# Patient Record
Sex: Male | Born: 1950 | Race: White | Hispanic: No | Marital: Married | Smoking: Former smoker
Health system: Southern US, Community
[De-identification: ages and names within clinical notes are randomized; demographics above are authoritative.]

## PROBLEM LIST (undated history)

## (undated) DIAGNOSIS — M1990 Unspecified osteoarthritis, unspecified site: Secondary | ICD-10-CM

## (undated) DIAGNOSIS — N189 Chronic kidney disease, unspecified: Secondary | ICD-10-CM

## (undated) HISTORY — PX: TRIGGER FINGER RELEASE: SHX641

---

## 2001-02-08 ENCOUNTER — Other Ambulatory Visit: Admission: RE | Admit: 2001-02-08 | Discharge: 2001-02-08 | Payer: Self-pay | Admitting: Family Medicine

## 2015-01-03 ENCOUNTER — Other Ambulatory Visit (HOSPITAL_COMMUNITY): Payer: Self-pay | Admitting: Orthopaedic Surgery

## 2015-01-03 DIAGNOSIS — M5442 Lumbago with sciatica, left side: Principal | ICD-10-CM

## 2015-01-03 DIAGNOSIS — M5441 Lumbago with sciatica, right side: Secondary | ICD-10-CM

## 2015-01-16 ENCOUNTER — Other Ambulatory Visit (HOSPITAL_COMMUNITY): Payer: Self-pay | Admitting: Orthopaedic Surgery

## 2015-01-16 ENCOUNTER — Ambulatory Visit (HOSPITAL_COMMUNITY)
Admission: RE | Admit: 2015-01-16 | Discharge: 2015-01-16 | Disposition: A | Discharge status: Home / Self Care | Payer: BLUE CROSS/BLUE SHIELD | Source: Ambulatory Visit | Attending: Orthopaedic Surgery | Admitting: Orthopaedic Surgery

## 2015-01-16 DIAGNOSIS — M47816 Spondylosis without myelopathy or radiculopathy, lumbar region: Secondary | ICD-10-CM | POA: Insufficient documentation

## 2015-01-16 DIAGNOSIS — M2580 Other specified joint disorders, unspecified joint: Secondary | ICD-10-CM | POA: Diagnosis not present

## 2015-01-16 DIAGNOSIS — M5136 Other intervertebral disc degeneration, lumbar region: Secondary | ICD-10-CM | POA: Diagnosis not present

## 2015-01-16 DIAGNOSIS — R52 Pain, unspecified: Secondary | ICD-10-CM | POA: Insufficient documentation

## 2015-01-16 DIAGNOSIS — I700 Atherosclerosis of aorta: Secondary | ICD-10-CM | POA: Insufficient documentation

## 2015-01-16 DIAGNOSIS — M5441 Lumbago with sciatica, right side: Secondary | ICD-10-CM

## 2015-01-16 DIAGNOSIS — M545 Low back pain, unspecified: Secondary | ICD-10-CM

## 2015-01-16 DIAGNOSIS — Z139 Encounter for screening, unspecified: Secondary | ICD-10-CM | POA: Insufficient documentation

## 2015-01-16 DIAGNOSIS — M5442 Lumbago with sciatica, left side: Secondary | ICD-10-CM

## 2015-01-16 DIAGNOSIS — M795 Residual foreign body in soft tissue: Secondary | ICD-10-CM | POA: Insufficient documentation

## 2015-09-12 ENCOUNTER — Other Ambulatory Visit (HOSPITAL_COMMUNITY): Payer: Self-pay | Admitting: Orthopaedic Surgery

## 2015-09-12 DIAGNOSIS — M5441 Lumbago with sciatica, right side: Secondary | ICD-10-CM

## 2015-09-12 DIAGNOSIS — M5442 Lumbago with sciatica, left side: Principal | ICD-10-CM

## 2015-09-26 ENCOUNTER — Ambulatory Visit (HOSPITAL_COMMUNITY)
Admission: RE | Admit: 2015-09-26 | Discharge: 2015-09-26 | Disposition: A | Discharge status: Home / Self Care | Payer: BLUE CROSS/BLUE SHIELD | Source: Ambulatory Visit | Attending: Orthopaedic Surgery | Admitting: Orthopaedic Surgery

## 2015-09-26 DIAGNOSIS — M79604 Pain in right leg: Secondary | ICD-10-CM | POA: Diagnosis present

## 2015-09-26 DIAGNOSIS — M545 Low back pain: Secondary | ICD-10-CM | POA: Insufficient documentation

## 2015-09-26 DIAGNOSIS — M4806 Spinal stenosis, lumbar region: Secondary | ICD-10-CM | POA: Insufficient documentation

## 2015-09-26 DIAGNOSIS — M5442 Lumbago with sciatica, left side: Secondary | ICD-10-CM

## 2015-09-26 DIAGNOSIS — M5126 Other intervertebral disc displacement, lumbar region: Secondary | ICD-10-CM | POA: Insufficient documentation

## 2015-09-26 DIAGNOSIS — M5136 Other intervertebral disc degeneration, lumbar region: Secondary | ICD-10-CM | POA: Insufficient documentation

## 2015-09-26 DIAGNOSIS — M5441 Lumbago with sciatica, right side: Secondary | ICD-10-CM

## 2015-10-24 ENCOUNTER — Ambulatory Visit (HOSPITAL_COMMUNITY)
Admission: RE | Admit: 2015-10-24 | Discharge: 2015-10-24 | Disposition: A | Discharge status: Home / Self Care | Payer: BLUE CROSS/BLUE SHIELD | Source: Ambulatory Visit | Attending: Surgery | Admitting: Surgery

## 2015-10-24 ENCOUNTER — Encounter (HOSPITAL_COMMUNITY)
Admission: RE | Admit: 2015-10-24 | Discharge: 2015-10-24 | Disposition: A | Discharge status: Home / Self Care | Payer: BLUE CROSS/BLUE SHIELD | Source: Ambulatory Visit | Attending: Specialist | Admitting: Specialist

## 2015-10-24 ENCOUNTER — Encounter (HOSPITAL_COMMUNITY): Payer: Self-pay

## 2015-10-24 DIAGNOSIS — Z01812 Encounter for preprocedural laboratory examination: Secondary | ICD-10-CM | POA: Diagnosis not present

## 2015-10-24 DIAGNOSIS — Z01818 Encounter for other preprocedural examination: Secondary | ICD-10-CM | POA: Diagnosis not present

## 2015-10-24 DIAGNOSIS — Z01810 Encounter for preprocedural cardiovascular examination: Secondary | ICD-10-CM | POA: Insufficient documentation

## 2015-10-24 HISTORY — DX: Chronic kidney disease, unspecified: N18.9

## 2015-10-24 HISTORY — DX: Unspecified osteoarthritis, unspecified site: M19.90

## 2015-10-24 LAB — URINALYSIS, ROUTINE W REFLEX MICROSCOPIC
Glucose, UA: NEGATIVE mg/dL
HGB URINE DIPSTICK: NEGATIVE
KETONES UR: NEGATIVE mg/dL
Leukocytes, UA: NEGATIVE
NITRITE: NEGATIVE
Protein, ur: NEGATIVE mg/dL
SPECIFIC GRAVITY, URINE: 1.043 — AB (ref 1.005–1.030)
pH: 5 (ref 5.0–8.0)

## 2015-10-24 LAB — COMPREHENSIVE METABOLIC PANEL
ALK PHOS: 75 U/L (ref 38–126)
ALT: 30 U/L (ref 17–63)
ANION GAP: 8 (ref 5–15)
AST: 35 U/L (ref 15–41)
Albumin: 4.1 g/dL (ref 3.5–5.0)
BILIRUBIN TOTAL: 0.5 mg/dL (ref 0.3–1.2)
BUN: 19 mg/dL (ref 6–20)
CALCIUM: 10.2 mg/dL (ref 8.9–10.3)
CO2: 26 mmol/L (ref 22–32)
Chloride: 108 mmol/L (ref 101–111)
Creatinine, Ser: 1.16 mg/dL (ref 0.61–1.24)
GFR calc Af Amer: >60 mL/min (ref >60)
GFR calc non Af Amer: >60 mL/min (ref >60)
Glucose, Bld: 151 mg/dL — ABNORMAL HIGH (ref 65–99)
POTASSIUM: 4.1 mmol/L (ref 3.5–5.1)
Sodium: 142 mmol/L (ref 135–145)
TOTAL PROTEIN: 7.1 g/dL (ref 6.5–8.1)

## 2015-10-24 LAB — CBC
HEMATOCRIT: 41.4 % (ref 39.0–52.0)
HEMOGLOBIN: 14 g/dL (ref 13.0–17.0)
MCH: 30.4 pg (ref 26.0–34.0)
MCHC: 33.8 g/dL (ref 30.0–36.0)
MCV: 90 fL (ref 78.0–100.0)
Platelets: 242 10*3/uL (ref 150–400)
RBC: 4.6 MIL/uL (ref 4.22–5.81)
RDW: 12.1 % (ref 11.5–15.5)
WBC: 7.5 10*3/uL (ref 4.0–10.5)

## 2015-10-24 LAB — SURGICAL PCR SCREEN
MRSA, PCR: NEGATIVE
STAPHYLOCOCCUS AUREUS: NEGATIVE

## 2015-10-24 LAB — PROTIME-INR
INR: 1.01 (ref 0.00–1.49)
PROTHROMBIN TIME: 13.5 s (ref 11.6–15.2)

## 2015-10-24 LAB — APTT: APTT: 29 s (ref 24–37)

## 2015-10-24 NOTE — Progress Notes (Signed)
Denies any cardiac issues.  No heart tests done. Only had 1 surgery which was trigger finger release PCP [place] Dr. Sudie BaileyKnowlton in [place], [place]   LOV  05/2015

## 2015-10-24 NOTE — Pre-Procedure Instructions (Signed)
Micheal QuarryJohn Mosley  10/24/2015      [name] - [place], [place] - [name] [place] [address] Phone: [phone] Fax: [phone]    Your procedure is scheduled on Monday, March 20th   [facility] Admitting at 5:30 AM             (Posted surgery time 7:30 am - 10:12 am)   Call this number if you have problems the morning of surgery:  [phone]   Remember:  Do not eat food or drink liquids after midnight Sunday.   Take these medicines the morning of surgery with A SIP OF WATER : NONE             4-5 days prior to surgery, STOP taking any vitamins, herbal supplements or medications, anti-inflammatories.   Do not wear jewelry, make-up or nail polish.  Do not wear lotions, powders, or perfumes.  You may NOT wear deodorant the day of surgery.   Do not shave underarms & legs 48 hours prior to surgery.    Do not bring valuables to the hospital.  [facility] is not responsible for any belongings or valuables.  Contacts, dentures or bridgework may not be worn into surgery.  Leave your suitcase in the car.  After surgery it may be brought to your room. For patients admitted to the hospital, discharge time will be determined by your treatment team.  Name and phone number of your driver:     Please read over the following fact sheets that you were given. Pain Booklet, MRSA Information and Surgical Site Infection Prevention

## 2015-10-26 MED ORDER — CEFAZOLIN SODIUM-DEXTROSE 2-3 GM-% IV SOLR
2.0000 g | INTRAVENOUS | Status: AC
  Administered 2015-10-29: 2 g via INTRAVENOUS
  Filled 2015-10-26: qty 50

## 2015-10-28 NOTE — Anesthesia Preprocedure Evaluation (Addendum)
Anesthesia Evaluation  Patient identified by MRN, date of birth, ID band Patient awake    Reviewed: Allergy & Precautions, NPO status , Patient's Chart, lab work & pertinent test results  History of Anesthesia Complications Negative for: history of anesthetic complications  Airway Mallampati: I  TM Distance: >3 FB Neck ROM: Full    Dental  (+) Edentulous Lower, Edentulous Upper, Missing   Pulmonary COPD, former smoker,    breath sounds clear to auscultation       Cardiovascular (-) anginanegative cardio ROS   Rhythm:Regular Rate:Normal     Neuro/Psych Spinal stenosis    GI/Hepatic negative GI ROS, Neg liver ROS,   Endo/Other  negative endocrine ROS  Renal/GU H/o stone     Musculoskeletal  (+) Arthritis , Osteoarthritis,    Abdominal (+)  Abdomen: soft. Bowel sounds: normal.  Peds  Hematology negative hematology ROS (+)   Anesthesia Other Findings   Reproductive/Obstetrics                         10/24/15 EKG: Normal sinus rhythm Normal ECG Conf[facility] by ARIDA [facility]  CBC    Component Value Date/Time   WBC 7.5 10/24/2015 1545   RBC 4.60 10/24/2015 1545   HGB 14.0 10/24/2015 1545   HCT 41.4 10/24/2015 1545   PLT 242 10/24/2015 1545   MCV 90.0 10/24/2015 1545   MCH 30.4 10/24/2015 1545   MCHC 33.8 10/24/2015 1545   RDW 12.1 10/24/2015 1545     Chemistry      Component Value Date/Time   NA 142 10/24/2015 1545   K 4.1 10/24/2015 1545   CL 108 10/24/2015 1545   CO2 26 10/24/2015 1545   BUN 19 10/24/2015 1545   CREATININE 1.16 10/24/2015 1545      Component Value Date/Time   CALCIUM 10.2 10/24/2015 1545   ALKPHOS 75 10/24/2015 1545   AST 35 10/24/2015 1545   ALT 30 10/24/2015 1545   BILITOT 0.5 10/24/2015 1545     BP Readings from Last 3 Encounters:  10/29/15 157/79      Anesthesia Physical Anesthesia Plan  ASA: II  Anesthesia Plan: General   Post-op  Pain Management:    Induction: Intravenous  Airway Management Planned: Oral ETT  Additional Equipment:   Intra-op Plan:   Post-operative Plan: Extubation in OR  Informed Consent: I have reviewed the patients History and Physical, chart, labs and discussed the procedure including the risks, benefits and alternatives for the proposed anesthesia with the patient or authorized representative who has indicated his/her understanding and acceptance.     Plan Discussed with: CRNA and Surgeon  Anesthesia Plan Comments: (Plan routine monitors, GETA)        Anesthesia Quick Evaluation

## 2015-10-29 ENCOUNTER — Observation Stay (HOSPITAL_COMMUNITY)
Admission: RE | Admit: 2015-10-29 | Discharge: 2015-10-29 | Disposition: A | Discharge status: Home / Self Care | Payer: BLUE CROSS/BLUE SHIELD | Source: Ambulatory Visit | Attending: Specialist | Admitting: Specialist

## 2015-10-29 ENCOUNTER — Ambulatory Visit (HOSPITAL_COMMUNITY): Payer: BLUE CROSS/BLUE SHIELD | Admitting: Anesthesiology

## 2015-10-29 ENCOUNTER — Encounter (HOSPITAL_COMMUNITY)
Admission: RE | Disposition: A | Discharge status: Home / Self Care | Payer: Self-pay | Source: Ambulatory Visit | Attending: Specialist

## 2015-10-29 ENCOUNTER — Encounter (HOSPITAL_COMMUNITY): Payer: Self-pay | Admitting: Surgery

## 2015-10-29 ENCOUNTER — Ambulatory Visit (HOSPITAL_COMMUNITY): Payer: BLUE CROSS/BLUE SHIELD

## 2015-10-29 DIAGNOSIS — Z87891 Personal history of nicotine dependence: Secondary | ICD-10-CM | POA: Diagnosis not present

## 2015-10-29 DIAGNOSIS — M1990 Unspecified osteoarthritis, unspecified site: Secondary | ICD-10-CM | POA: Diagnosis not present

## 2015-10-29 DIAGNOSIS — Z419 Encounter for procedure for purposes other than remedying health state, unspecified: Secondary | ICD-10-CM

## 2015-10-29 DIAGNOSIS — M5116 Intervertebral disc disorders with radiculopathy, lumbar region: Secondary | ICD-10-CM | POA: Insufficient documentation

## 2015-10-29 DIAGNOSIS — M5136 Other intervertebral disc degeneration, lumbar region: Secondary | ICD-10-CM | POA: Diagnosis not present

## 2015-10-29 DIAGNOSIS — M48062 Spinal stenosis, lumbar region with neurogenic claudication: Secondary | ICD-10-CM | POA: Diagnosis present

## 2015-10-29 DIAGNOSIS — J449 Chronic obstructive pulmonary disease, unspecified: Secondary | ICD-10-CM | POA: Insufficient documentation

## 2015-10-29 DIAGNOSIS — M4806 Spinal stenosis, lumbar region: Secondary | ICD-10-CM | POA: Diagnosis present

## 2015-10-29 DIAGNOSIS — Z87442 Personal history of urinary calculi: Secondary | ICD-10-CM | POA: Diagnosis not present

## 2015-10-29 HISTORY — PX: LUMBAR LAMINECTOMY/DECOMPRESSION MICRODISCECTOMY: SHX5026

## 2015-10-29 SURGERY — LUMBAR LAMINECTOMY/DECOMPRESSION MICRODISCECTOMY
Anesthesia: General

## 2015-10-29 MED ORDER — ALUM & MAG HYDROXIDE-SIMETH 200-200-20 MG/5ML PO SUSP
30.0000 mL | Freq: Four times a day (QID) | ORAL | Status: DC | PRN
Start: 2015-10-29 — End: 2015-10-29

## 2015-10-29 MED ORDER — LIDOCAINE HCL (CARDIAC) 20 MG/ML IV SOLN
INTRAVENOUS | Status: AC
  Filled 2015-10-29: qty 5

## 2015-10-29 MED ORDER — ACETAMINOPHEN 325 MG PO TABS
650.0000 mg | ORAL_TABLET | ORAL | Status: DC | PRN
  Filled 2015-10-29 (×2): qty 2

## 2015-10-29 MED ORDER — CEFAZOLIN SODIUM 1-5 GM-% IV SOLN
1.0000 g | Freq: Three times a day (TID) | INTRAVENOUS | Status: DC
  Administered 2015-10-29: 1 g via INTRAVENOUS
  Filled 2015-10-29 (×2): qty 50

## 2015-10-29 MED ORDER — THROMBIN 20000 UNITS EX SOLR
CUTANEOUS | Status: AC
Start: 2015-10-29 — End: 2015-10-29
  Filled 2015-10-29: qty 20000

## 2015-10-29 MED ORDER — PROPOFOL 10 MG/ML IV BOLUS
INTRAVENOUS | Status: AC
  Filled 2015-10-29: qty 40

## 2015-10-29 MED ORDER — SODIUM CHLORIDE 0.9 % IJ SOLN
INTRAMUSCULAR | Status: AC
  Filled 2015-10-29: qty 10

## 2015-10-29 MED ORDER — MIDAZOLAM HCL 2 MG/2ML IJ SOLN: 0.5000 mg | Freq: Once | INTRAMUSCULAR | Status: DC | PRN

## 2015-10-29 MED ORDER — PHENYLEPHRINE 40 MCG/ML (10ML) SYRINGE FOR IV PUSH (FOR BLOOD PRESSURE SUPPORT)
PREFILLED_SYRINGE | INTRAVENOUS | Status: AC
  Filled 2015-10-29: qty 10

## 2015-10-29 MED ORDER — GLYCOPYRROLATE 0.2 MG/ML IJ SOLN
INTRAMUSCULAR | Status: AC
  Filled 2015-10-29: qty 1

## 2015-10-29 MED ORDER — FENTANYL CITRATE (PF) 250 MCG/5ML IJ SOLN
INTRAMUSCULAR | Status: AC
  Filled 2015-10-29: qty 5

## 2015-10-29 MED ORDER — METHOCARBAMOL 1000 MG/10ML IJ SOLN
500.0000 mg | Freq: Four times a day (QID) | INTRAVENOUS | Status: DC | PRN
  Filled 2015-10-29: qty 5

## 2015-10-29 MED ORDER — PROPOFOL 10 MG/ML IV BOLUS
INTRAVENOUS | Status: AC
  Filled 2015-10-29: qty 20

## 2015-10-29 MED ORDER — PHENYLEPHRINE 40 MCG/ML (10ML) SYRINGE FOR IV PUSH (FOR BLOOD PRESSURE SUPPORT)
PREFILLED_SYRINGE | INTRAVENOUS | Status: AC
Start: 2015-10-29 — End: 2015-10-29
  Filled 2015-10-29: qty 10

## 2015-10-29 MED ORDER — SODIUM CHLORIDE 0.9 % IV SOLN: 250.0000 mL | INTRAVENOUS | Status: DC

## 2015-10-29 MED ORDER — BUPIVACAINE LIPOSOME 1.3 % IJ SUSP
INTRAMUSCULAR | Status: DC | PRN
  Administered 2015-10-29: 20 mL

## 2015-10-29 MED ORDER — LACTATED RINGERS IV SOLN
INTRAVENOUS | Status: DC | PRN
  Administered 2015-10-29 (×2): via INTRAVENOUS

## 2015-10-29 MED ORDER — CELECOXIB 200 MG PO CAPS: 200.0000 mg | ORAL_CAPSULE | Freq: Every day | ORAL | Status: DC

## 2015-10-29 MED ORDER — ROCURONIUM BROMIDE 50 MG/5ML IV SOLN
INTRAVENOUS | Status: AC
  Filled 2015-10-29: qty 1

## 2015-10-29 MED ORDER — DEXAMETHASONE SODIUM PHOSPHATE 10 MG/ML IJ SOLN
INTRAMUSCULAR | Status: AC
  Filled 2015-10-29: qty 2

## 2015-10-29 MED ORDER — 0.9 % SODIUM CHLORIDE (POUR BTL) OPTIME
TOPICAL | Status: DC | PRN
  Administered 2015-10-29: 1000 mL

## 2015-10-29 MED ORDER — EPHEDRINE SULFATE 50 MG/ML IJ SOLN
INTRAMUSCULAR | Status: AC
  Filled 2015-10-29: qty 1

## 2015-10-29 MED ORDER — MORPHINE SULFATE (PF) 2 MG/ML IV SOLN: 1.0000 mg | INTRAVENOUS | Status: DC | PRN

## 2015-10-29 MED ORDER — POLYETHYLENE GLYCOL 3350 17 G PO PACK: 17.0000 g | PACK | Freq: Every day | ORAL | Status: DC | PRN

## 2015-10-29 MED ORDER — HYDROCODONE-ACETAMINOPHEN 5-325 MG PO TABS: 1.0000 | ORAL_TABLET | Freq: Four times a day (QID) | ORAL | Status: DC | PRN

## 2015-10-29 MED ORDER — FENTANYL CITRATE (PF) 250 MCG/5ML IJ SOLN
INTRAMUSCULAR | Status: AC
Start: 2015-10-29 — End: 2015-10-29
  Filled 2015-10-29: qty 5

## 2015-10-29 MED ORDER — FENTANYL CITRATE (PF) 100 MCG/2ML IJ SOLN
INTRAMUSCULAR | Status: DC | PRN
  Administered 2015-10-29 (×2): 50 ug via INTRAVENOUS
  Administered 2015-10-29: 250 ug via INTRAVENOUS

## 2015-10-29 MED ORDER — ONDANSETRON HCL 4 MG/2ML IJ SOLN
INTRAMUSCULAR | Status: AC
  Filled 2015-10-29: qty 2

## 2015-10-29 MED ORDER — PHENOL 1.4 % MT LIQD: 1.0000 | OROMUCOSAL | Status: DC | PRN

## 2015-10-29 MED ORDER — SODIUM CHLORIDE 0.9% FLUSH
3.0000 mL | Freq: Two times a day (BID) | INTRAVENOUS | Status: DC
  Administered 2015-10-29: 3 mL via INTRAVENOUS

## 2015-10-29 MED ORDER — ROCURONIUM BROMIDE 100 MG/10ML IV SOLN
INTRAVENOUS | Status: DC | PRN
  Administered 2015-10-29: 10 mg via INTRAVENOUS
  Administered 2015-10-29: 50 mg via INTRAVENOUS
  Administered 2015-10-29: 20 mg via INTRAVENOUS

## 2015-10-29 MED ORDER — HYDROCODONE-ACETAMINOPHEN 5-325 MG PO TABS: 1.0000 | ORAL_TABLET | ORAL | Status: DC | PRN

## 2015-10-29 MED ORDER — ACETAMINOPHEN 650 MG RE SUPP: 650.0000 mg | RECTAL | Status: DC | PRN

## 2015-10-29 MED ORDER — DEXAMETHASONE SODIUM PHOSPHATE 10 MG/ML IJ SOLN
INTRAMUSCULAR | Status: DC | PRN
  Administered 2015-10-29: 10 mg via INTRAVENOUS

## 2015-10-29 MED ORDER — LIDOCAINE HCL (CARDIAC) 20 MG/ML IV SOLN
INTRAVENOUS | Status: DC | PRN
  Administered 2015-10-29: 20 mg via INTRAVENOUS

## 2015-10-29 MED ORDER — KETOROLAC TROMETHAMINE 30 MG/ML IJ SOLN
30.0000 mg | Freq: Four times a day (QID) | INTRAMUSCULAR | Status: DC
  Administered 2015-10-29: 30 mg via INTRAVENOUS
  Filled 2015-10-29: qty 1

## 2015-10-29 MED ORDER — OXYCODONE-ACETAMINOPHEN 5-325 MG PO TABS: 1.0000 | ORAL_TABLET | ORAL | Status: DC | PRN

## 2015-10-29 MED ORDER — PHENYLEPHRINE HCL 10 MG/ML IJ SOLN
10.0000 mg | INTRAVENOUS | Status: DC | PRN
  Administered 2015-10-29: 15 ug/min via INTRAVENOUS

## 2015-10-29 MED ORDER — MEPERIDINE HCL 25 MG/ML IJ SOLN: 6.2500 mg | INTRAMUSCULAR | Status: DC | PRN

## 2015-10-29 MED ORDER — METHOCARBAMOL 500 MG PO TABS: 500.0000 mg | ORAL_TABLET | Freq: Three times a day (TID) | ORAL | Status: DC | PRN

## 2015-10-29 MED ORDER — PROPOFOL 10 MG/ML IV BOLUS
INTRAVENOUS | Status: DC | PRN
  Administered 2015-10-29: 120 mg via INTRAVENOUS

## 2015-10-29 MED ORDER — BUPIVACAINE HCL 0.5 % IJ SOLN
INTRAMUSCULAR | Status: DC | PRN
  Administered 2015-10-29: 30 mL

## 2015-10-29 MED ORDER — PHENYLEPHRINE HCL 10 MG/ML IJ SOLN
INTRAMUSCULAR | Status: DC | PRN
  Administered 2015-10-29: 80 ug via INTRAVENOUS
  Administered 2015-10-29: 120 ug via INTRAVENOUS
  Administered 2015-10-29: 80 ug via INTRAVENOUS
  Administered 2015-10-29: 120 ug via INTRAVENOUS
  Administered 2015-10-29 (×3): 80 ug via INTRAVENOUS

## 2015-10-29 MED ORDER — METHOCARBAMOL 500 MG PO TABS: 500.0000 mg | ORAL_TABLET | Freq: Four times a day (QID) | ORAL | Status: DC | PRN

## 2015-10-29 MED ORDER — MIDAZOLAM HCL 5 MG/5ML IJ SOLN
INTRAMUSCULAR | Status: DC | PRN
  Administered 2015-10-29 (×2): 1 mg via INTRAVENOUS

## 2015-10-29 MED ORDER — DOCUSATE SODIUM 100 MG PO CAPS: 100.0000 mg | ORAL_CAPSULE | Freq: Two times a day (BID) | ORAL | Status: DC

## 2015-10-29 MED ORDER — CHLORHEXIDINE GLUCONATE 4 % EX LIQD: 60.0000 mL | Freq: Once | CUTANEOUS | Status: DC

## 2015-10-29 MED ORDER — SUCCINYLCHOLINE CHLORIDE 20 MG/ML IJ SOLN
INTRAMUSCULAR | Status: AC
  Filled 2015-10-29: qty 1

## 2015-10-29 MED ORDER — SODIUM CHLORIDE 0.9% FLUSH: 3.0000 mL | INTRAVENOUS | Status: DC | PRN

## 2015-10-29 MED ORDER — PROMETHAZINE HCL 25 MG/ML IJ SOLN: 6.2500 mg | INTRAMUSCULAR | Status: DC | PRN

## 2015-10-29 MED ORDER — FLEET ENEMA 7-19 GM/118ML RE ENEM: 1.0000 | ENEMA | Freq: Once | RECTAL | Status: DC | PRN

## 2015-10-29 MED ORDER — HYDROMORPHONE HCL 1 MG/ML IJ SOLN: 0.2500 mg | INTRAMUSCULAR | Status: DC | PRN

## 2015-10-29 MED ORDER — PRAVASTATIN SODIUM 40 MG PO TABS
80.0000 mg | ORAL_TABLET | Freq: Every evening | ORAL | Status: DC
  Administered 2015-10-29: 80 mg via ORAL
  Filled 2015-10-29: qty 2

## 2015-10-29 MED ORDER — MENTHOL 3 MG MT LOZG
1.0000 | LOZENGE | OROMUCOSAL | Status: DC | PRN
Start: 2015-10-29 — End: 2015-10-29

## 2015-10-29 MED ORDER — PANTOPRAZOLE SODIUM 40 MG IV SOLR: 40.0000 mg | Freq: Every day | INTRAVENOUS | Status: DC

## 2015-10-29 MED ORDER — THROMBIN 20000 UNITS EX KIT
PACK | CUTANEOUS | Status: DC | PRN
  Administered 2015-10-29: 20000 [IU] via TOPICAL

## 2015-10-29 MED ORDER — KCL IN DEXTROSE-NACL 10-5-0.45 MEQ/L-%-% IV SOLN
INTRAVENOUS | Status: DC
  Administered 2015-10-29: 12:00:00 via INTRAVENOUS
  Filled 2015-10-29 (×2): qty 1000

## 2015-10-29 MED ORDER — SUGAMMADEX SODIUM 200 MG/2ML IV SOLN
INTRAVENOUS | Status: AC
  Filled 2015-10-29: qty 2

## 2015-10-29 MED ORDER — DEXAMETHASONE SODIUM PHOSPHATE 10 MG/ML IJ SOLN
INTRAMUSCULAR | Status: AC
  Filled 2015-10-29: qty 1

## 2015-10-29 MED ORDER — ONDANSETRON HCL 4 MG/2ML IJ SOLN: 4.0000 mg | INTRAMUSCULAR | Status: DC | PRN

## 2015-10-29 MED ORDER — ZOLPIDEM TARTRATE 5 MG PO TABS: 5.0000 mg | ORAL_TABLET | Freq: Every evening | ORAL | Status: DC | PRN

## 2015-10-29 MED ORDER — MIDAZOLAM HCL 2 MG/2ML IJ SOLN
INTRAMUSCULAR | Status: AC
  Filled 2015-10-29: qty 2

## 2015-10-29 MED ORDER — BISACODYL 5 MG PO TBEC: 5.0000 mg | DELAYED_RELEASE_TABLET | Freq: Every day | ORAL | Status: DC | PRN

## 2015-10-29 MED ORDER — SUGAMMADEX SODIUM 200 MG/2ML IV SOLN
INTRAVENOUS | Status: DC | PRN
  Administered 2015-10-29: 160 mg via INTRAVENOUS

## 2015-10-29 MED ORDER — BUPIVACAINE HCL (PF) 0.5 % IJ SOLN
INTRAMUSCULAR | Status: AC
  Filled 2015-10-29: qty 30

## 2015-10-29 MED ORDER — ONDANSETRON HCL 4 MG/2ML IJ SOLN
INTRAMUSCULAR | Status: DC | PRN
  Administered 2015-10-29: 4 mg via INTRAVENOUS

## 2015-10-29 MED ORDER — KETOROLAC TROMETHAMINE 30 MG/ML IJ SOLN
INTRAMUSCULAR | Status: DC | PRN
  Administered 2015-10-29: 30 mg via INTRAVENOUS

## 2015-10-29 MED ORDER — BUPIVACAINE LIPOSOME 1.3 % IJ SUSP
20.0000 mL | Freq: Once | INTRAMUSCULAR | Status: DC
  Filled 2015-10-29: qty 20

## 2015-10-29 SURGICAL SUPPLY — 50 items
ADH SKN CLS APL DERMABOND .7 (GAUZE/BANDAGES/DRESSINGS) ×1
BUR RND FLUTED 2.5 (BURR) IMPLANT
BUR SABER RD CUTTING 3.0 (BURR) ×1 IMPLANT
BUR SABER RD CUTTING 3.0MM (BURR) ×1
CANISTER SUCTION 2500CC (MISCELLANEOUS) ×3 IMPLANT
COVER SURGICAL LIGHT HANDLE (MISCELLANEOUS) ×3 IMPLANT
DERMABOND ADVANCED (GAUZE/BANDAGES/DRESSINGS) ×2
DERMABOND ADVANCED .7 DNX12 (GAUZE/BANDAGES/DRESSINGS) ×1 IMPLANT
DRAPE INCISE IOBAN 66X45 STRL (DRAPES) IMPLANT
DRAPE MICROSCOPE LEICA (MISCELLANEOUS) ×3 IMPLANT
DRAPE PROXIMA HALF (DRAPES) IMPLANT
DRAPE SURG 17X23 STRL (DRAPES) ×12 IMPLANT
DRSG MEPILEX BORDER 4X4 (GAUZE/BANDAGES/DRESSINGS) ×2 IMPLANT
DRSG MEPILEX BORDER 4X8 (GAUZE/BANDAGES/DRESSINGS) IMPLANT
DURAPREP 26ML APPLICATOR (WOUND CARE) ×3 IMPLANT
ELECT REM PT RETURN 9FT ADLT (ELECTROSURGICAL) ×3
ELECTRODE REM PT RTRN 9FT ADLT (ELECTROSURGICAL) ×1 IMPLANT
EVACUATOR 1/8 PVC DRAIN (DRAIN) IMPLANT
GLOVE BIOGEL PI IND STRL 8 (GLOVE) ×1 IMPLANT
GLOVE BIOGEL PI INDICATOR 8 (GLOVE) ×2
GLOVE ECLIPSE 9.0 STRL (GLOVE) ×3 IMPLANT
GLOVE ORTHO TXT STRL SZ7.5 (GLOVE) ×3 IMPLANT
GLOVE SURG 8.5 LATEX PF (GLOVE) ×3 IMPLANT
GOWN STRL REUS W/ TWL LRG LVL3 (GOWN DISPOSABLE) ×1 IMPLANT
GOWN STRL REUS W/TWL 2XL LVL3 (GOWN DISPOSABLE) ×6 IMPLANT
GOWN STRL REUS W/TWL LRG LVL3 (GOWN DISPOSABLE) ×3
KIT BASIN OR (CUSTOM PROCEDURE TRAY) ×3 IMPLANT
KIT ROOM TURNOVER OR (KITS) ×3 IMPLANT
NDL SPNL 18GX3.5 QUINCKE PK (NEEDLE) ×2 IMPLANT
NEEDLE SPNL 18GX3.5 QUINCKE PK (NEEDLE) ×6 IMPLANT
NS IRRIG 1000ML POUR BTL (IV SOLUTION) ×3 IMPLANT
PACK LAMINECTOMY ORTHO (CUSTOM PROCEDURE TRAY) ×3 IMPLANT
PAD ARMBOARD 7.5X6 YLW CONV (MISCELLANEOUS) ×6 IMPLANT
PATTIES SURGICAL .5 X.5 (GAUZE/BANDAGES/DRESSINGS) IMPLANT
PATTIES SURGICAL .75X.75 (GAUZE/BANDAGES/DRESSINGS) IMPLANT
PATTIES SURGICAL 1X1 (DISPOSABLE) IMPLANT
SPONGE LAP 4X18 X RAY DECT (DISPOSABLE) ×2 IMPLANT
SPONGE SURGIFOAM ABS GEL 100 (HEMOSTASIS) ×2 IMPLANT
SUT VIC AB 0 CT1 27 (SUTURE) ×3
SUT VIC AB 0 CT1 27XBRD ANBCTR (SUTURE) IMPLANT
SUT VIC AB 1 CT1 27 (SUTURE) ×3
SUT VIC AB 1 CT1 27XBRD ANBCTR (SUTURE) IMPLANT
SUT VIC AB 2-0 CT1 27 (SUTURE) ×3
SUT VIC AB 2-0 CT1 TAPERPNT 27 (SUTURE) IMPLANT
SUT VIC AB 3-0 X1 27 (SUTURE) ×3 IMPLANT
SUT VICRYL 0 UR6 27IN ABS (SUTURE) ×1 IMPLANT
TOWEL OR 17X24 6PK STRL BLUE (TOWEL DISPOSABLE) ×3 IMPLANT
TOWEL OR 17X26 10 PK STRL BLUE (TOWEL DISPOSABLE) ×3 IMPLANT
TRAY FOLEY CATH 16FRSI W/METER (SET/KITS/TRAYS/PACK) IMPLANT
WATER STERILE IRR 1000ML POUR (IV SOLUTION) ×3 IMPLANT

## 2015-10-29 NOTE — Anesthesia Postprocedure Evaluation (Signed)
Anesthesia Post Note  Patient: Micheal Mosley  Procedure(s) Performed: Procedure(s) (LRB): L4-5 DECOMPRESSION (N/A)  Patient location during evaluation: PACU Anesthesia Type: General Level of consciousness: awake and alert, oriented and patient cooperative Pain management: pain level controlled Vital Signs Assessment: post-procedure vital signs reviewed and stable Respiratory status: spontaneous breathing, nonlabored ventilation and respiratory function stable Cardiovascular status: blood pressure returned to baseline and stable Postop Assessment: no signs of nausea or vomiting Anesthetic complications: no    Last Vitals:  Filed Vitals:   10/29/15 0935 10/29/15 0950  BP: 140/89 155/88  Pulse: 72 69  Temp:    Resp: 17 16    Last Pain: There were no vitals filed for this visit.               Germaine PomfretJACKSON,E. Nikkia Devoss

## 2015-10-29 NOTE — Brief Op Note (Signed)
PATIENT ID:      Micheal QuarryJohn Mosley  MRN:     [id number] DOB/AGE:    04/14/1951 / 65 y.o.       OPERATIVE REPORT   DATE OF PROCEDURE:  10/29/2015      PREOPERATIVE DIAGNOSIS:   L4-5 spinal stenosis                                                       Body mass index is 23.32 kg/(m^2).    POSTOPERATIVE DIAGNOSIS:   L4-5 spinal stenosis                                                                     Body mass index is 23.32 kg/(m^2).    PROCEDURE:  Procedure(s): L4-5 DECOMPRESSION    SURGEON: NITKA,JAMES E   ASSISTANT: Zonia KiefJames Owens, PA-C         ANESTHESIA:  General and and local infiltration with marcaine/exparel 1:1 total : 20 cc Dr. Jean RosenthalJackson.     DRAINS: None  COMPLICATIONS:  None     CONDITION:  Stable, return to PACU extubated in good condition.    NITKA,JAMES E 10/29/2015, 7:29 AM

## 2015-10-29 NOTE — Discharge Summary (Signed)
Physician Discharge Summary      Patient ID: Micheal Mosley MRN: [id number] DOB/AGE: 03-18-1951 65 y.o.  Admit date: 10/29/2015 Discharge date:10/29/2015   Admission Diagnoses:  Principal Problem:   Spinal stenosis, lumbar region, with neurogenic claudication   Discharge Diagnoses:  Same  Past Medical History  Diagnosis Date  . Chronic kidney disease     h/o kidney stone  . Arthritis     Surgeries: Procedure(s): L4-5 DECOMPRESSION on 10/29/2015   Consultants:    Discharged Condition: Improved  Hospital Course: Micheal Mosley is an 65 y.o. male who was admitted 10/29/2015 with a chief complaint of No chief complaint on file. , and found to have a diagnosis of Spinal stenosis, lumbar region, with neurogenic claudication.  He was brought to the operating room on 10/29/2015 and underwent the above named procedures.    He was given perioperative antibiotics:  Anti-infectives    Start     Dose/Rate Route Frequency Ordered Stop   10/29/15 1400  ceFAZolin (ANCEF) IVPB 1 g/50 mL premix     1 g 100 mL/hr over 30 Minutes Intravenous Every 8 hours 10/29/15 1035 10/30/15 0559   10/29/15 0700  ceFAZolin (ANCEF) IVPB 2 g/50 mL premix     2 g 100 mL/hr over 30 Minutes Intravenous To [facility]Stay Surgical 10/26/15 0808 10/29/15 0806    Recovered unevenfully in the PACU. Transferred to med-surg floor. Was seen by PT and OT and demonstrated ability to walk independently in the hallway x 3. Voiding normally. Legs were NV normal on exam this evening. Dressing changed, incision was dry without drainage. We was discharged home the evening of the day of his surgery   He was given sequential compression devices, early ambulation, and chemoprophylaxis for DVT prophylaxis.  He benefited maximally from their hospital stay and there were no complications.    Recent vital signs:  Filed Vitals:   10/29/15 1012 10/29/15 1033  BP:  159/88  Pulse:  62  Temp: 97.5 F (36.4 C) 97.9 F (36.6 C)  Resp:   18    Recent laboratory studies:  Results for orders placed or performed during the hospital encounter of 10/24/15  Surgical pcr screen  Result Value Ref Range   MRSA, PCR NEGATIVE NEGATIVE   Staphylococcus aureus NEGATIVE NEGATIVE  APTT  Result Value Ref Range   aPTT 29 24 - 37 seconds  CBC  Result Value Ref Range   WBC 7.5 4.0 - 10.5 K/uL   RBC 4.60 4.22 - 5.81 MIL/uL   Hemoglobin 14.0 13.0 - 17.0 g/dL   HCT 13.0 86.5 - 78.4 %   MCV 90.0 78.0 - 100.0 fL   MCH 30.4 26.0 - 34.0 pg   MCHC 33.8 30.0 - 36.0 g/dL   RDW 69.6 29.5 - 28.4 %   Platelets 242 150 - 400 K/uL  Comprehensive metabolic panel  Result Value Ref Range   Sodium 142 135 - 145 mmol/L   Potassium 4.1 3.5 - 5.1 mmol/L   Chloride 108 101 - 111 mmol/L   CO2 26 22 - 32 mmol/L   Glucose, Bld 151 (H) 65 - 99 mg/dL   BUN 19 6 - 20 mg/dL   Creatinine, Ser 1.32 0.61 - 1.24 mg/dL   Calcium 44.0 8.9 - 10.2 mg/dL   Total Protein 7.1 6.5 - 8.1 g/dL   Albumin 4.1 3.5 - 5.0 g/dL   AST 35 15 - 41 U/L   ALT 30 17 - 63 U/L   Alkaline Phosphatase 75 38 -  126 U/L   Total Bilirubin 0.5 0.3 - 1.2 mg/dL   GFR calc non Af Amer >60 >60 mL/min   GFR calc Af Amer >60 >60 mL/min   Anion gap 8 5 - 15  Protime-INR  Result Value Ref Range   Prothrombin Time 13.5 11.6 - 15.2 seconds   INR 1.01 0.00 - 1.49  Urinalysis, Routine w reflex microscopic (not at [facility])  Result Value Ref Range   Color, Urine AMBER (A) YELLOW   APPearance CLEAR CLEAR   Specific Gravity, Urine 1.043 (H) 1.005 - 1.030   pH 5.0 5.0 - 8.0   Glucose, UA NEGATIVE NEGATIVE mg/dL   Hgb urine dipstick NEGATIVE NEGATIVE   Bilirubin Urine SMALL (A) NEGATIVE   Ketones, ur NEGATIVE NEGATIVE mg/dL   Protein, ur NEGATIVE NEGATIVE mg/dL   Nitrite NEGATIVE NEGATIVE   Leukocytes, UA NEGATIVE NEGATIVE    Discharge Medications:     Medication List    TAKE these medications        acetaminophen 650 MG CR tablet  Commonly known as:  TYLENOL  Take 1,300 mg by mouth  every 8 (eight) hours as needed for pain.     celecoxib 200 MG capsule  Commonly known as:  CELEBREX  Take 200 mg by mouth daily.     HYDROcodone-acetaminophen 5-325 MG tablet  Commonly known as:  NORCO  Take 1-2 tablets by mouth every 6 (six) hours as needed for moderate pain. MAXIMUM TOTAL ACETAMINOPHEN DOSE IS 4000 MG PER DAY     methocarbamol 500 MG tablet  Commonly known as:  ROBAXIN  Take 1 tablet (500 mg total) by mouth every 8 (eight) hours as needed for muscle spasms.     pravastatin 80 MG tablet  Commonly known as:  PRAVACHOL  Take 80 mg by mouth every evening.        Diagnostic Studies: Dg Chest 2 View  10/24/2015  CLINICAL DATA:  Preop for decompression of L4-5, former smoking history EXAM: CHEST  2 VIEW COMPARISON:  None. FINDINGS: No active infiltrate or effusion is seen. Mediastinal and hilar contours are unremarkable. The heart is within normal limits in size. There are mild degenerative changes in the lower thoracic spine. IMPRESSION: No active cardiopulmonary disease. Electronically Signed   By: Dwyane Dee M.D.   On: 10/24/2015 16:19   Dg Lumbar Spine 2-3 Views  10/29/2015  CLINICAL DATA:  Back pain. EXAM: LUMBAR SPINE - 2-3 VIEW COMPARISON:  09/26/2015. FINDINGS: Lumbar vertebra numbered as per prior MRI of 09/26/2015 . Metallic marker is noted posteriorly at L3-L4. Diffuse degenerative change. No acute bony abnormality . IMPRESSION: Metallic marker noted posteriorly at L3-L4. Electronically Signed   By: Maisie Fus  Register   On: 10/29/2015 08:46    Disposition: Final discharge disposition not confirmed      Discharge Instructions    Call MD / Call 911    Complete by:  As directed   If you experience chest pain or shortness of breath, CALL 911 and be transported to the hospital emergency room.  If you develope a fever above 101 F, pus (white drainage) or increased drainage or redness at the wound, or calf pain, call your surgeon's office.     Constipation  Prevention    Complete by:  As directed   Drink plenty of fluids.  Prune juice may be helpful.  You may use a stool softener, such as Colace (over the counter) 100 mg twice a day.  Use MiraLax (over the counter)  for constipation as needed.     Diet - low sodium heart healthy    Complete by:  As directed      Discharge instructions    Complete by:  As directed   No lifting greater than 10 lbs. Avoid bending, stooping and twisting. Walk in house for first week them may start to get out slowly increasing distance up to one quarter mile by 3 weeks post op. Keep incision dry for 3 days, may use tegaderm or similar water impervious dressing.     Driving restrictions    Complete by:  As directed   No driving for 3 weeks     Increase activity slowly as tolerated    Complete by:  As directed      Lifting restrictions    Complete by:  As directed   No lifting for 6 weeks           Follow-up Information    Follow up with NITKA,JAMES E, MD In 2 weeks.   Specialty:  Orthopedic Surgery   Why:  For suture removal   Contact information:   [address] [place] [address] [phone]        Signed: Kerrin ChampagneITKA,JAMES E 10/29/2015, 6:47 PM

## 2015-10-29 NOTE — Progress Notes (Signed)
OT Cancellation Note  Patient Details Name: Micheal QuarryJohn Mosley MRN: [id number] DOB: 06/09/1951   Cancelled Treatment:    Reason Eval/Treat Not Completed: OT screened, no needs identified, will sign off-Talked to PT.  Earlie RavelingStraub, Bevan Vu L OTR/L [phone] 10/29/2015, 2:05 PM

## 2015-10-29 NOTE — Op Note (Signed)
10/29/2015  9:16 AM  PATIENT:  Micheal Mosley  65 y.o. male  MRN: [id number]  OPERATIVE REPORT  PRE-OPERATIVE DIAGNOSIS:  L4-5 spinal stenosis  POST-OPERATIVE DIAGNOSIS:  L4-5 spinal stenosis  PROCEDURE:  Procedure(s): L4-5 DECOMPRESSION    SURGEON:  James E. Nitka, MD     ASSISTANT:  James Owens, PA-C  (Present throughout the entire procedure and necessary for completion of procedure in a timely manner)     ANESTHESIA:  General,supplemented with local anethesia marcaine 0.5% 1:1 exparel 1.3% total of 20cc, Dr. Jackson.    COMPLICATIONS:  None.  EBL: <75cc      PROCEDURE:The patient was met in the holding area, and the appropriate left lumbar level L4-5 identified and marked with "x" and my initials.The patient was then transported to OR and was placed under general anesthesia without difficulty. The patient received appropriate preoperative antibiotic prophylaxis.The patient after intubation atraumatically was transferred to the operating room table, prone position, Wilson frame, Jackson OR table. All pressure points were well padded. The arms in 90-90 well-padded at the elbows. Standard prep with DuraPrep solution lower dorsal spine to the mid sacral segment. Draped in the usual manner iodine Vi-Drape was used. Time-out procedure was called and correct. 2x 18-gauge spinal needle was then inserted at the expected L4 level. Cross table lateral used to identify the spinal needles position. The needle was at the upper aspect of the lamina of L4  Skin inferior to this was then infiltrated with Marcaine half percent 1:1 exparel total of 20 cc used. An incision approximately an inch inch and a half in length was then made through skin and subcutaneous layers in line with the right side of the expected midline just superior to the spinal needle entry point. An incision made into the right lumbosacral fascia approximately an inch in length .  Cobb elevator was then introduced into the incision  site and used to carefully form subperiosteal movement of the bilateral  paralumbar muscles off of the posterior lamina of the expected L4-5 level. . The depth measured off of the Cobb at about 60 mm and 60 mm retractors and placed on the scaffolding for Boss McCollough equipment and guided down to and docking on the posterior aspect of the lamina at the expected L4-5 level.  Cross table lateral radiograph was used to identify a Penfield#4 at the L5-S1 level. The next level then approached. First removing a portion of the inferior lamina bilaterally at the L4 level extending superiorly to the insertion of the ligamentum flavum. The operating room microscope sterilely draped brought into the field. Under the operating room microscope, the L4-5 interspace carefully debrided the small amount of muscle attachment here and high-speed bur used to drill the medial aspect of the inferior articular process of L4 approximately 10%. 3 mm Kerrison then used to enter the spinal canal over the superior aspect of the L5 lamina carefully using the Kerrison to debris the attachment as a curet. Foraminotomy was then performed over the L5 nerve root. The medial 10% superior articular process of L5 and then resected using an osteotome and 2 mm Kerrison. This allowed for identification of the thecal sac. Penfield 4 was then used to carefully mobilize the thecal sac medially and the L5 nerve root identified within the lateral recess flattened by overlying thickened ligamentum flavum. Further foraminotomies was performed over the L4 nerve roots  The L4 nerve root was noted to be decompressed. The nerve root able to be retracted along the medial   aspect of the L5 pedicle and the medial aspect of the L4-5 faceted debrided of reflected ligamentum flavum..  Ligamentum flavum was further debrided superiorly to the level L4-5 disc. Had a moderate amount of further resection of the L4 lamina inferiorly was performed. With this then the disc  space at L4-5 was easily visualized and though bulging was not found to be herniated. Ligamentum flavum was debrided and lateral recess along the medial aspect L4-5 facet no further decompression was necessary. Ball tip nerve probe was then able to carefully palpate the neuroforamen for L4 and L5 finding these to be well decompressed. Attention then turned to the right L4-5 level which was easily visualized with the microscope. Soft tissues debrided about the posterior aspect of the L4-5 interspace. High-speed bur and then used to carefully drill inferior 3 or 4 mm of the right side L4 lamina and on the medial aspect of the right L4 inferior articular process of 3 mm. The superior margin of the L5 lamina then carefully debrided with curette and a 2 mm kerrison then used to enter the spinal canal over the superior aspect of the L5 lamina resecting bone over the superior aspect and freeing up the attachment of ligamentum flavum here. Ligamentum flavum then debrided with the 2 mm and 3 mm Kerrisons we decompressed the L5 nerve root and the lateral recess right L4-5 decompressed using 2 and 3 mm Kerrisons sizing hypertrophic reflected ligamentum flavum extending superiorly. From was resected off the ventral aspect of the inferior margin of the L4 lamina. Hockey-stick nerve probe could then be passed out the L4 neuroforamen and the L5 neuroforamen. Venous bleeding encountered. Thrombin-soaked Gelfoam used to control this following this then the sac and the L5 nerve root were mobilized medially and the L4-5 disc examined and found not to be herniated. Irrigation was carried out down to this bleeding controlled with Gelfoam. Gelfoam was then removed. Irrigation carried careful examination demonstrated no active bleeding present. Retractors were then carefully removed Since carefully then the Bleeding was then controlled using thrombin-soaked Gelfoam small cottonoids. Small amount of bleeding within the soft tissue mass the  laminotomy area was controlled using bipolar electrocautery. Irrigation was carried out using copious amounts of irrigant solution. All Gelfoam were then removed. No significant active bleeding present at the time of removal. All instruments sponge counts were correct traction system was then removed and only bipolar electrocautery of any small bleeders. Lumbodorsal fascia was then carefully approximated with interrupted 0 Vicryl sutures, UR 6 needle deep subcutaneous layers were approximated with interrupted 0 Vicryl sutures on UR 6 the appear subcutaneous layers approximated with interrupted 2-0 Vicryl sutures and the skin closed with a running subcutaneous stitch of 4-0 Vicryl. Dermabond was applied allowed to dry and then Mepilex bandage applied. Patient was then carefully returned to supine position on a stretcher, reactivated and extubated. He was then returned to recovery room in satisfactory condition.  James Owens, PA-C perform the duties of assistant surgeon during this case. he was present from the beginning of the case to the end of the case assisting in transfer the patient from his stretcher to the OR table and back to the stretcher at the end of the case. Assisted in careful retraction and suction of the laminectomy site delicate neural structures operating under the operating room microscope. He performed closure of the incision from the fascia to the skin applying the dressing.     NITKA,JAMES E  10/29/2015, 9:16 AM   

## 2015-10-29 NOTE — Anesthesia Procedure Notes (Signed)
Procedure Name: Intubation Date/Time: 10/29/2015 7:33 AM Performed by: Fabian NovemberSOLHEIM, Salote Weidmann SALOMAN Pre-anesthesia Checklist: Patient identified, Patient being monitored, Timeout performed, Emergency Drugs available and Suction available Patient Re-evaluated:Patient Re-evaluated prior to inductionOxygen Delivery Method: Circle System Utilized Preoxygenation: Pre-oxygenation with 100% oxygen Intubation Type: IV induction Ventilation: Mask ventilation without difficulty and Oral airway inserted - appropriate to patient size Laryngoscope Size: Miller and 3 Grade View: Grade I Tube type: Oral Tube size: 8.0 mm Number of attempts: 1 Airway Equipment and Method: Stylet Placement Confirmation: ETT inserted through vocal cords under direct vision,  positive ETCO2 and breath sounds checked- equal and bilateral Secured at: 23 cm Tube secured with: Tape Dental Injury: Teeth and Oropharynx as per pre-operative assessment

## 2015-10-29 NOTE — Transfer of Care (Signed)
Immediate Anesthesia Transfer of Care Note  Patient: Micheal QuarryJohn Mosley  Procedure(s) Performed: Procedure(s): L4-5 DECOMPRESSION (N/A)  Patient Location: PACU  Anesthesia Type:General  Level of Consciousness: awake, alert , oriented and patient cooperative  Airway & Oxygen Therapy: Patient Spontanous Breathing and Patient connected to nasal cannula oxygen  Post-op Assessment: Report given to RN, Post -op Vital signs reviewed and stable and Patient moving all extremities X 4  Post vital signs: Reviewed and stable  Last Vitals:  Filed Vitals:   10/29/15 0603  BP: 157/79  Pulse: 75  Temp: 36.9 C  Resp: 20    Complications: No apparent anesthesia complications

## 2015-10-29 NOTE — Evaluation (Signed)
Physical Therapy Evaluation/ Discharge Patient Details Name: Micheal Mosley MRN: [id number] DOB: 10/22/1950 Today's Date: 10/29/2015   History of Present Illness  65 yo pt admitted for L4-5 decompression. PMHx: arthritis  Clinical Impression  Pt moving very well. Micheal Mosley was educated for all precautions, transfers, gait, sitting for 45 min or less, dressing all with following his precautions. Pt able to state and demonstrate understanding of all with all questions answered with family present. Pt independent with functional activities and does not require further therapy at this time. Pt reports having some pain in LLE after long periods PTA but currently no pain, weakness or decreased sensation. Pt agreeable to no further needs and will sign off.     Follow Up Recommendations No PT follow up    Equipment Recommendations  None recommended by PT    Recommendations for Other Services       Precautions / Restrictions Precautions Precautions: Back Precaution Booklet Issued: Yes (comment) Restrictions Weight Bearing Restrictions: No      Mobility  Bed Mobility Overal bed mobility: Modified Independent                Transfers Overall transfer level: Modified independent                  Ambulation/Gait Ambulation/Gait assistance: Modified independent (Device/Increase time) Ambulation Distance (Feet): 500 Feet Assistive device: None Gait Pattern/deviations: WFL(Within Functional Limits)   Gait velocity interpretation: at or above normal speed for age/gender    Stairs Stairs: Yes Stairs assistance: Modified independent (Device/Increase time) Stair Management: One rail Left;Alternating pattern;Forwards Number of Stairs: 5    Wheelchair Mobility    Modified Rankin (Stroke Patients Only)       Balance                                             Pertinent Vitals/Pain Pain Assessment: No/denies pain    Home Living Family/patient  expects to be discharged to:: Private residence Living Arrangements: Spouse/significant other Available Help at Discharge: Family;Available 24 hours/day Type of Home: House Home Access: Stairs to enter   Entergy CorporationEntrance Stairs-Number of Steps: 3 Home Layout: One level Home Equipment: Walker - 2 wheels;Shower seat - built in;Hand held shower head      Prior Function Level of Independence: Independent               Higher education careers adviserHand Dominance        Extremity/Trunk Assessment   Upper Extremity Assessment: Overall WFL for tasks assessed           Lower Extremity Assessment: Overall WFL for tasks assessed      Cervical / Trunk Assessment: Normal  Communication   Communication: No difficulties  Cognition Arousal/Alertness: Awake/alert Behavior During Therapy: WFL for tasks assessed/performed Overall Cognitive Status: Within Functional Limits for tasks assessed                      General Comments      Exercises        Assessment/Plan    PT Assessment Patent does not need any further PT services  PT Diagnosis Difficulty walking   PT Problem List    PT Treatment Interventions     PT Goals (Current goals can be found in the Care Plan section) Acute Rehab PT Goals PT Goal Formulation: All assessment and education complete, DC therapy  Frequency     Barriers to discharge        Co-evaluation               End of Session   Activity Tolerance: Patient tolerated treatment well Patient left: in chair;with call bell/phone within reach;with family/visitor present Nurse Communication: Mobility status;Precautions    Functional Assessment Tool Used: clinical judgement Functional Limitation: Mobility: Walking and moving around Mobility: Walking and Moving Around Current Status [phone]): 0 percent impaired, limited or restricted Mobility: Walking and Moving Around Goal Status [phone]): 0 percent impaired, limited or restricted Mobility: Walking and Moving  Around Discharge Status [phone]): 0 percent impaired, limited or restricted    Time: 1244-1302 PT Time Calculation (min) (ACUTE ONLY): 18 min   Charges:   PT Evaluation $PT Eval Low Complexity: 1 Procedure     PT G Codes:   PT G-Codes **NOT FOR INPATIENT CLASS** Functional Assessment Tool Used: clinical judgement Functional Limitation: Mobility: Walking and moving around Mobility: Walking and Moving Around Current Status ([id number]): 0 percent impaired, limited or restricted Mobility: Walking and Moving Around Goal Status (G9562): 0 percent impaired, limited or restricted Mobility: Walking and Moving Around Discharge Status [phone]): 0 percent impaired, limited or restricted    Micheal Mosley 10/29/2015, 1:12 PM Micheal Mosley, PT [phone]

## 2015-10-29 NOTE — Discharge Instructions (Signed)
° ° °  No lifting greater than 10 lbs. °Avoid bending, stooping and twisting. °Walk in house for first week them may start to get out slowly increasing distance up to one quarter mile by 3 weeks post op. °Keep incision dry for 3 days, may use tegaderm or similar water impervious dressing. ° °

## 2015-10-29 NOTE — Interval H&P Note (Signed)
The patient has been re-examined, and the chart reviewed, and there have been no interval changes to the documented history and physical.    The risks, benefits, and alternatives have been discussed at length, and the patient is willing to proceed.   

## 2015-10-29 NOTE — Progress Notes (Signed)
     Subjective: Day of Surgery Procedure(s) (LRB): L4-5 DECOMPRESSION (N/A) Awake, alert and oriented x4. Moderate pain, walked in hallway x 3. I want to go home. Patient reports pain as moderate.    Objective:   VITALS:  Temp:  [97.5 F (36.4 C)-98.4 F (36.9 C)] 97.9 F (36.6 C) (03/20 1033) Pulse Rate:  [62-75] 62 (03/20 1033) Resp:  [16-20] 18 (03/20 1033) BP: (140-159)/(79-89) 159/88 mmHg (03/20 1033) SpO2:  [97 %-100 %] 97 % (03/20 1033) Weight:  [158 lb (71.668 kg)] 158 lb (71.668 kg) (03/20 0603)  Neurologically intact ABD soft Neurovascular intact Sensation intact distally Intact pulses distally Dorsiflexion/Plantar flexion intact Incision: no drainage No cellulitis present   LABS No results for input(s): HGB, WBC, PLT in the last 72 hours. No results for input(s): NA, K, CL, CO2, BUN, CREATININE, GLUCOSE in the last 72 hours. No results for input(s): LABPT, INR in the last 72 hours.   Assessment/Plan: Day of Surgery Procedure(s) (LRB): L4-5 DECOMPRESSION (N/A)  Advance diet Up with therapy Discharge home with home health tonight. Up already and did well with PT/OT  Adrinne Sze E 10/29/2015, 6:44 PM

## 2015-10-29 NOTE — H&P (Signed)
Micheal Mosley is an 65 y.o. male.   Chief Complaint:  Low back pain and left le radiculopathy HPI:  Patient with hx of L4-5 stenosis presents with the above complaint.  Progressively worsening symptoms.  Failed conservative treatment.    Past Medical History  Diagnosis Date  . Chronic kidney disease     h/o kidney stone  . Arthritis     Past Surgical History  Procedure Laterality Date  . Trigger finger release      on the right hand    History reviewed. No pertinent family history. Social History:  reports that he has quit smoking. He quit smokeless tobacco use about 32 years ago. His smokeless tobacco use included Chew. He reports that he drinks about 4.2 oz of alcohol per week. He reports that he does not use illicit drugs.  Allergies: No Known Allergies  Medications Prior to Admission  Medication Sig Dispense Refill  . acetaminophen (TYLENOL) 650 MG CR tablet Take 1,300 mg by mouth every 8 (eight) hours as needed for pain.    . celecoxib (CELEBREX) 200 MG capsule Take 200 mg by mouth daily.    . pravastatin (PRAVACHOL) 80 MG tablet Take 80 mg by mouth every evening.      No results found for this or any previous visit (from the past 48 hour(s)). No results found.  Review of Systems  Constitutional: Negative.   HENT: Negative.   Eyes: Negative.   Respiratory: Negative.   Cardiovascular: Negative.   Gastrointestinal: Negative.   Genitourinary: Negative.   Musculoskeletal: Positive for back pain.  Skin: Negative.   Neurological: Positive for tingling.  Psychiatric/Behavioral: Negative.     Blood pressure 157/79, pulse 75, temperature 98.4 F (36.9 C), temperature source Oral, resp. rate 20, height  (1.753 m), weight 71.668 kg (158 lb), SpO2 98 %. Physical Exam     EXAM: MRI LUMBAR SPINE WITHOUT CONTRAST  TECHNIQUE: Multiplanar, multisequence MR imaging of the lumbar spine was performed. No intravenous contrast was administered.  COMPARISON: CT lumbar  spine 01/16/2015.  FINDINGS: Normal lumbar segmentation depicted on the comparison. Stable vertebral height and alignment. Mild right far lateral in marrow edema at L3 appears to be degenerative in nature. Similar mild anterior superior L1 endplate edema. No other acute osseous abnormality identified.  Visualized lower thoracic spinal cord is normal with conus medularis at T12-L1.  Visualized abdominal viscera and paraspinal soft tissues are within normal limits.  T11-T12: Negative.  T12-L1: Negative.  L1-L2: Mild disc desiccation. Otherwise negative.  L2-L3: Disc desiccation and moderate size right eccentric circumferential disc bulge. Borderline to mild facet hypertrophy. Mild mass effect on the right lateral recess. Mild left and moderate right L2 foraminal stenosis (series 3, image 5 on the right).  L3-L4: Disc desiccation mild circumferential disc bulge. Superimposed broad-based right lateral recess and subarticular disc protrusion best seen on series 6, image 21. Mild facet hypertrophy. Mild left and moderate right lateral recess stenosis (descending L4 nerve root level). Mild bilateral L3 foraminal stenosis.  L4-L5: Disc desiccation with circumferential disc bulge. Mild to moderate facet and mild ligament flavum hypertrophy. Moderate spinal stenosis (series 6, image 27) with right greater than left lateral recess stenosis. Mild left and mild to moderate right L4 foraminal stenosis.  L5-S1: Circumferential disc osteophyte complex with central posterior component. Moderate facet hypertrophy. Still, no significant spinal or lateral recess stenosis. Moderate left and mild right L5 foraminal stenosis.  IMPRESSION: 1. Widespread lumbar disc degeneration. Mild degenerative endplate marrow edema but  no other acute osseous abnormality. 2. Multifactorial moderate spinal and bilateral lateral recess stenosis at L4-L5, with right greater than left L4  foraminal stenosis. 3. Multifactorial moderate left L5 foraminal stenosis at L5-S1. L3-L4 disc disc protrusion affecting the lateral recesses right greater than left.  Assessment/Plan L4-5 decompression Surgical procedure procedure along with possible risks and complications discussed in great detail.  All questions answered and he wishes to proceed.   Naida SleightWENS,Kaleth Koy M, PA-C 10/29/2015, 6:21 AM   Patient examined and lab reviewed with Barry Dieneswens, PA-C.

## 2015-10-30 ENCOUNTER — Encounter (HOSPITAL_COMMUNITY): Payer: Self-pay | Admitting: Specialist

## 2015-10-30 MED FILL — Thrombin For Soln 20000 Unit: CUTANEOUS | Qty: 1 | Status: AC

## 2018-01-28 IMAGING — CR DG LUMBAR SPINE 2-3V
3 series · 3 of 3 positions shown · non-contrast
Comparison: 09/26/2015.

CLINICAL DATA: Back pain.

EXAM:
LUMBAR SPINE - 2-3 VIEW

[lateral (1 of 3)]
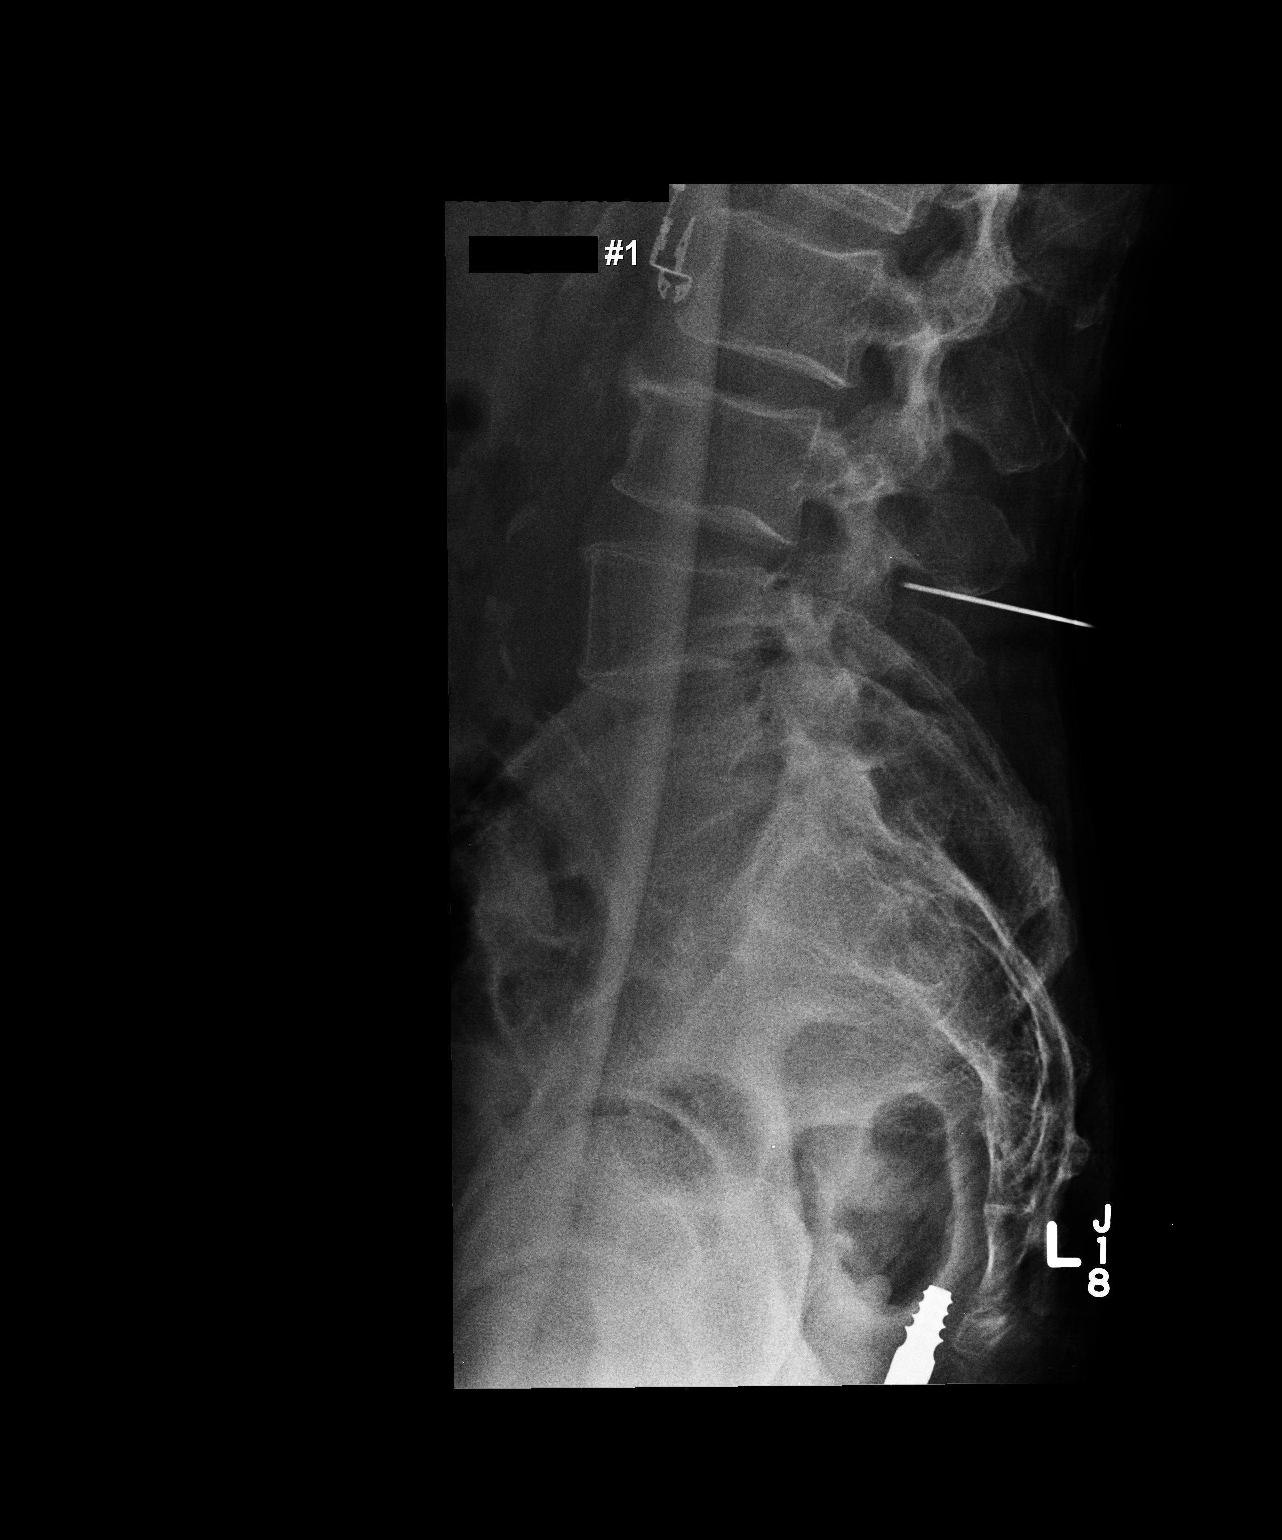

[lateral (2 of 3)]
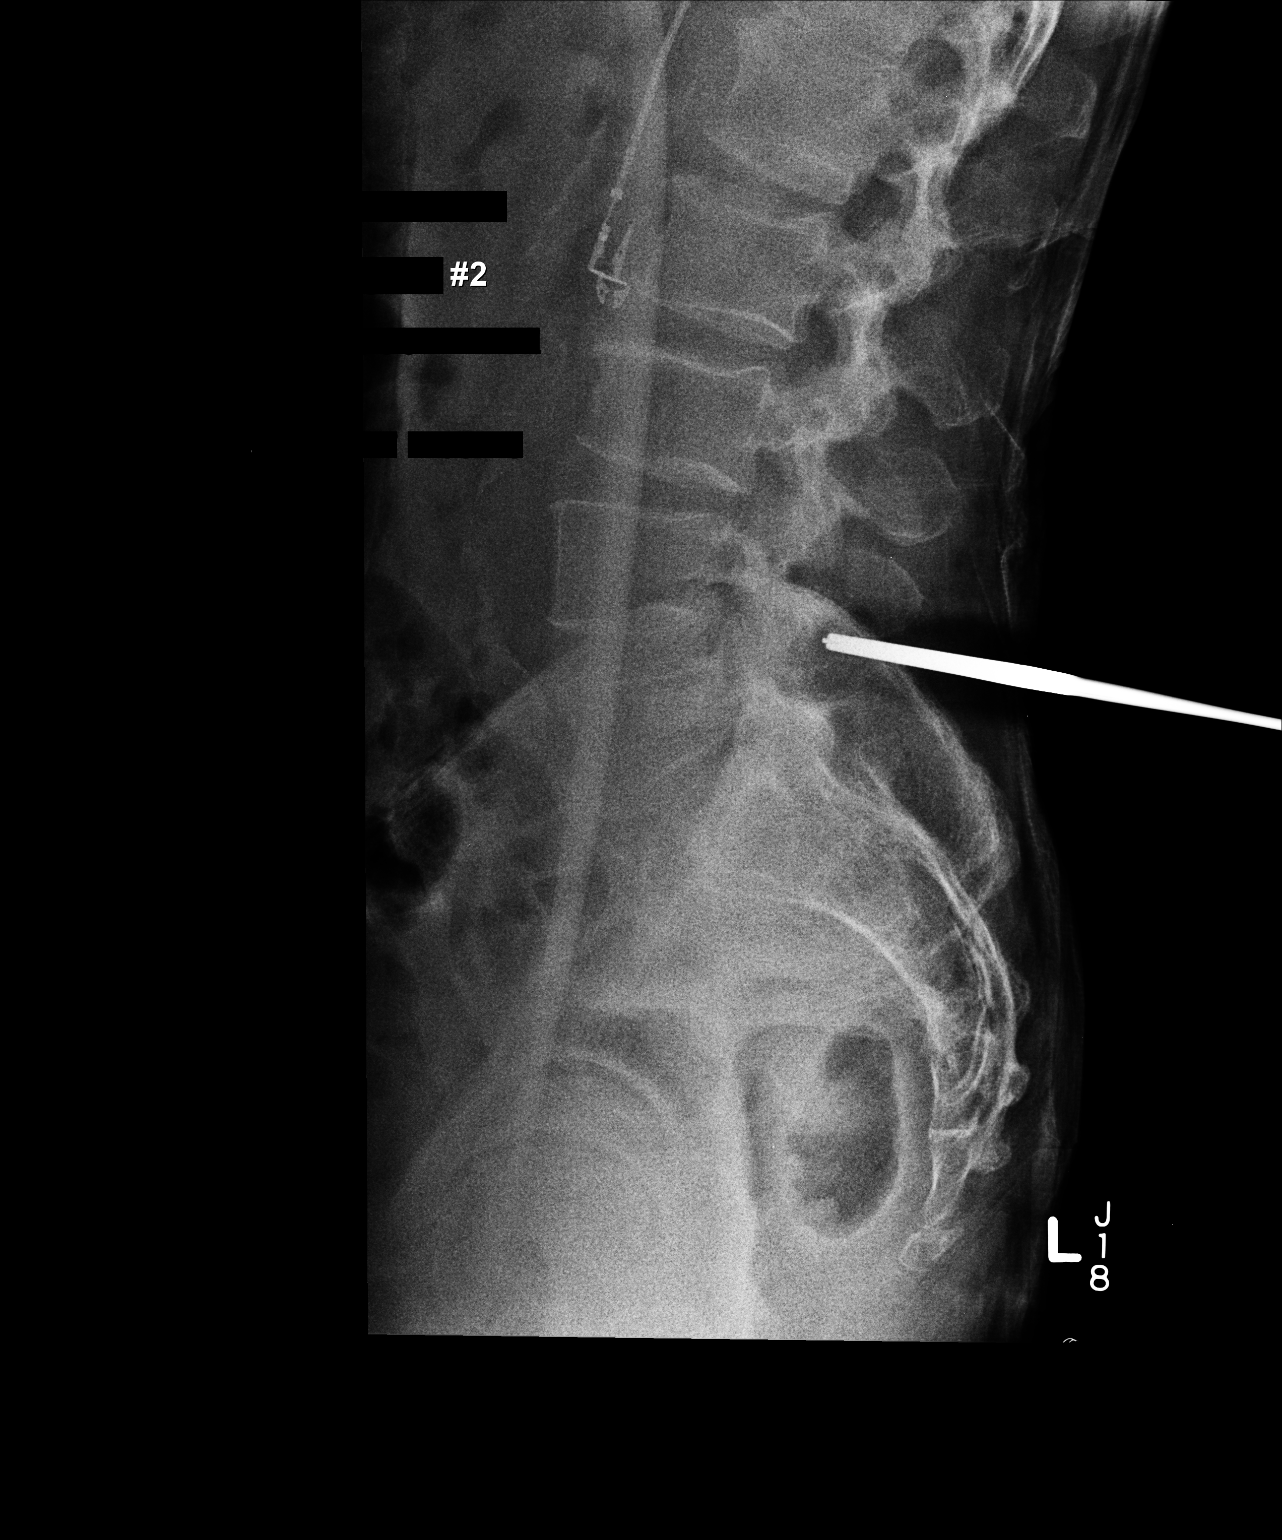

[lateral (3 of 3)]
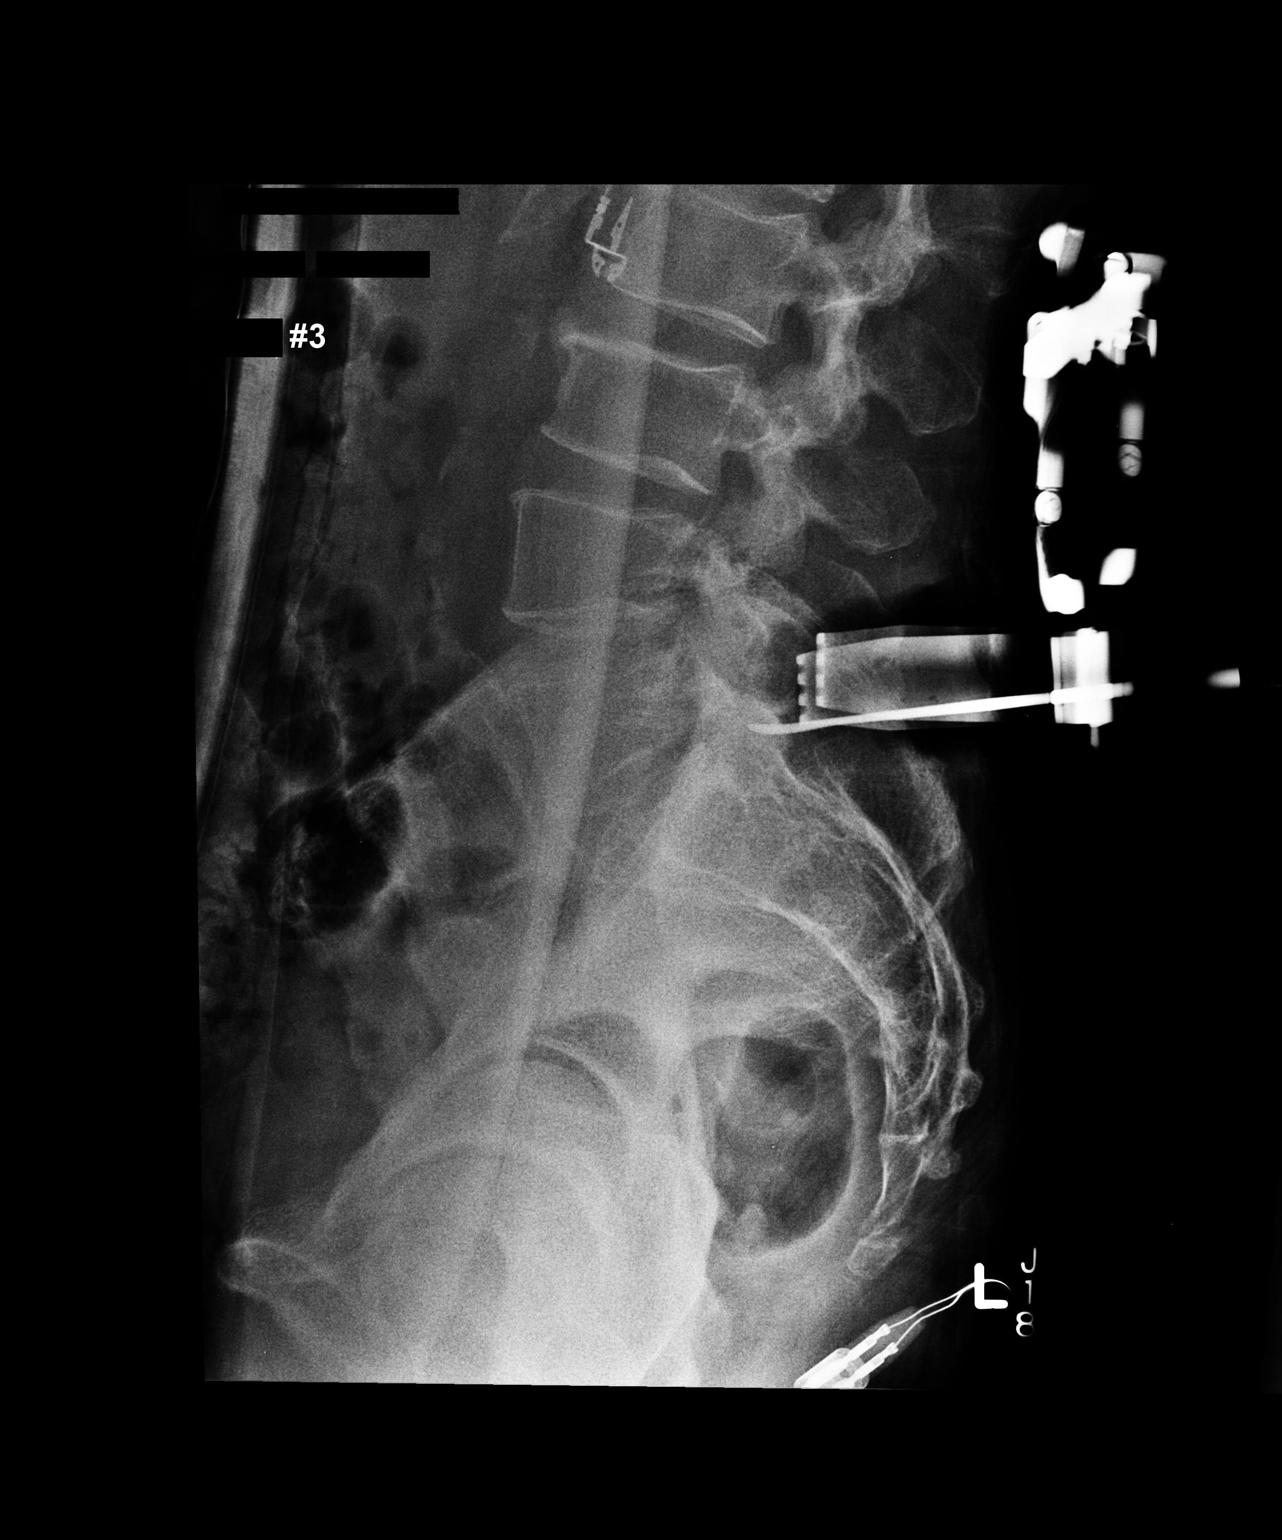

[3 of 3 positions shown; findings below may reference images not displayed]

FINDINGS: Lumbar vertebra numbered as per prior MRI of 09/26/2015 . Metallic
marker is noted posteriorly at L3-L4. Diffuse degenerative change.
No acute bony abnormality .
IMPRESSION: Metallic marker noted posteriorly at L3-L4.

## 2018-05-31 ENCOUNTER — Telehealth (INDEPENDENT_AMBULATORY_CARE_PROVIDER_SITE_OTHER): Payer: Self-pay | Admitting: Specialist

## 2018-05-31 NOTE — Telephone Encounter (Signed)
Patients wife called to set up appt for patient, states he is having severe neck pain and had surgery 2 years ago with some of the same symptoms as before. He had opening for Wednesday for established pt- I checked with triage since I was unable to reach you. Hopefully this is ok, if not let me know and I can call patient back # [phone]

## 2018-06-01 NOTE — Telephone Encounter (Signed)
That will be fine. 

## 2018-06-02 ENCOUNTER — Ambulatory Visit (INDEPENDENT_AMBULATORY_CARE_PROVIDER_SITE_OTHER): Payer: Medicare Other | Admitting: Specialist

## 2018-06-02 ENCOUNTER — Ambulatory Visit (INDEPENDENT_AMBULATORY_CARE_PROVIDER_SITE_OTHER): Payer: Self-pay

## 2018-06-02 ENCOUNTER — Encounter (INDEPENDENT_AMBULATORY_CARE_PROVIDER_SITE_OTHER): Payer: Self-pay | Admitting: Specialist

## 2018-06-02 VITALS — BP 160/87 | HR 65 | Ht 69.0 in | Wt 165.0 lb

## 2018-06-02 DIAGNOSIS — M4722 Other spondylosis with radiculopathy, cervical region: Secondary | ICD-10-CM

## 2018-06-02 DIAGNOSIS — M25511 Pain in right shoulder: Secondary | ICD-10-CM | POA: Diagnosis not present

## 2018-06-02 DIAGNOSIS — M542 Cervicalgia: Secondary | ICD-10-CM | POA: Diagnosis not present

## 2018-06-02 MED ORDER — METHYLPREDNISOLONE 4 MG PO TBPK: ORAL_TABLET | ORAL | 0 refills | Status: DC

## 2018-06-02 NOTE — Progress Notes (Signed)
Office Visit Note   Patient: Micheal Mosley           Date of Birth: [birth year]           MRN: [id number] Visit Date: 06/02/2018              Requested by: Gareth Morgan, MD [address] [place] [address] PCP: Gareth Morgan, MD   Assessment & Plan: Visit Diagnoses:  1. Cervicalgia   2. Right shoulder pain, unspecified chronicity   3. Other spondylosis with radiculopathy, cervical region     Plan: Avoid overhead lifting and overhead use of the arms. Do not lift greater than 5-10 lbs. Adjust head rest in vehicle to prevent hyperextension if rear ended. Take extra precautions to avoid falling. Medrol dose pak. Tramadol for more severe pain. Go to PT at [facility].   Follow-Up Instructions: No follow-ups on file.   Orders:  Orders Placed This Encounter  Procedures  . XR Cervical Spine 2 or 3 views  . XR Shoulder Right   No orders of the defined types were placed in this encounter.     Procedures: No procedures performed   Clinical Data: No additional findings.   Subjective: Chief Complaint  Patient presents with  . Neck - Pain  . Right Shoulder - Pain    67 year old male right handed with a several month history of neck stiffness and pain into the right shoulder. The pain started when in the shower and he felt the pain into the right neck and radiation into the right arm. He notices difficulty with turning the neck to the left.     Review of Systems  Constitutional: Negative.  Negative for activity change, appetite change, chills, diaphoresis, fatigue, fever and unexpected weight change.  HENT: Negative.   Eyes: Negative.   Respiratory: Negative.   Cardiovascular: Negative.   Gastrointestinal: Negative.   Endocrine: Negative.   Genitourinary: Negative.   Musculoskeletal: Negative.   Skin: Negative.   Allergic/Immunologic: Negative.   Neurological: Negative.   Hematological: Negative.   Psychiatric/Behavioral: Negative.       Objective: Vital Signs: BP (!) 160/87 (BP Location: Left Arm, Patient Position: Sitting)   Pulse 65   Ht 5\' 9"  (1.753 m)   Wt 165 lb (74.8 kg)   BMI 24.37 kg/m   Physical Exam  Constitutional: He is oriented to person, place, and time. He appears well-developed and well-nourished.  HENT:  Head: Normocephalic and atraumatic.  Eyes: Pupils are equal, round, and reactive to light. EOM are normal.  Neck: Normal range of motion. Neck supple.  Pulmonary/Chest: Effort normal and breath sounds normal.  Abdominal: Soft. Bowel sounds are normal.  Neurological: He is alert and oriented to person, place, and time.  Skin: Skin is warm and dry.  Psychiatric: He has a normal mood and affect. His behavior is normal. Judgment and thought content normal.    Back Exam   Tenderness  The patient is experiencing tenderness in the cervical.  Range of Motion  Extension: abnormal  Flexion: normal  Lateral bend right: 80  Lateral bend left:  60 abnormal  Rotation right: abnormal  Rotation left: abnormal   Muscle Strength  Right Quadriceps:  5/5  Left Quadriceps:  5/5  Right Hamstrings:  5/5  Left Hamstrings:  5/5   Tests  Straight leg raise right: negative Straight leg raise left: negative  Reflexes  Patellar: 3/4 Biceps: Hyporeflexic Babinski's sign: normal   Comments:  No focal  motor deficity.      Specialty Comments:  No specialty comments available.  Imaging: No results found.   PMFS History: Patient Active Problem List   Diagnosis Date Noted  . Spinal stenosis, lumbar region, with neurogenic claudication 10/29/2015    Priority: High    Class: Chronic   Past Medical History:  Diagnosis Date  . Arthritis   . Chronic kidney disease    h/o kidney stone    No family history on file.  Past Surgical History:  Procedure Laterality Date  . LUMBAR LAMINECTOMY/DECOMPRESSION MICRODISCECTOMY N/A 10/29/2015   Procedure: L4-5 DECOMPRESSION;  Surgeon: Kerrin Champagne,  MD;  Location: [facility] OR;  Service: Orthopedics;  Laterality: N/A;  . TRIGGER FINGER RELEASE     on the right hand   Social History   Occupational History  . Not on file  Tobacco Use  . Smoking status: Former Smoker    Packs/day: 0.50    Years: 8.00    Pack years: 4.00  . Smokeless tobacco: Former Neurosurgeon    Types: Chew    Quit date: 10/24/1983  Substance and Sexual Activity  . Alcohol use: Yes    Alcohol/week: 7.0 standard drinks    Types: 7 Shots of liquor per week    Comment: drinks rum  . Drug use: No  . Sexual activity: Not on file

## 2018-06-02 NOTE — Patient Instructions (Signed)
Avoid overhead lifting and overhead use of the arms. Do not lift greater than 5-10 lbs. Adjust head rest in vehicle to prevent hyperextension if rear ended. Take extra precautions to avoid falling. Medrol dose pak. Tramadol for more severe pain. Go to PT at [facility].

## 2018-06-15 ENCOUNTER — Encounter: Payer: Self-pay | Admitting: Physical Therapy

## 2018-06-15 ENCOUNTER — Other Ambulatory Visit: Payer: Self-pay

## 2018-06-15 ENCOUNTER — Ambulatory Visit: Payer: Medicare Other | Attending: Specialist | Admitting: Physical Therapy

## 2018-06-15 DIAGNOSIS — M5412 Radiculopathy, cervical region: Secondary | ICD-10-CM | POA: Insufficient documentation

## 2018-06-15 DIAGNOSIS — M542 Cervicalgia: Secondary | ICD-10-CM | POA: Diagnosis present

## 2018-06-15 DIAGNOSIS — M47812 Spondylosis without myelopathy or radiculopathy, cervical region: Secondary | ICD-10-CM | POA: Insufficient documentation

## 2018-06-15 NOTE — Therapy (Signed)
[facility] [address] [place], [place], [address] Phone: [phone]   Fax:  [phone]  Physical Therapy Evaluation  Patient Details  Name: Micheal Mosley MRN: [id number] Date of Birth: 03-29-1951 Referring Provider (PT): Vira Browns, MD   Encounter Date: 06/15/2018  PT End of Session - 06/15/18 2053    Visit Number  1    Number of Visits  12    Date for PT Re-Evaluation  08/03/18    Authorization Type  Progress note every 10th visits; KX modifier after 15th visit    PT Start Time  1515    PT Stop Time  1558    PT Time Calculation (min)  43 min    Activity Tolerance  Patient tolerated treatment well    Behavior During Therapy  [facility] for tasks assessed/performed       Past Medical History:  Diagnosis Date  . Arthritis   . Chronic kidney disease    h/o kidney stone    Past Surgical History:  Procedure Laterality Date  . LUMBAR LAMINECTOMY/DECOMPRESSION MICRODISCECTOMY N/A 10/29/2015   Procedure: L4-5 DECOMPRESSION;  Surgeon: Kerrin Champagne, MD;  Location: [facility] OR;  Service: Orthopedics;  Laterality: N/A;  . TRIGGER FINGER RELEASE     on the right hand    There were no vitals filed for this visit.   Subjective Assessment - 06/15/18 2049    Subjective  Patient arrives to physical therapy with reports of cervical and right upper trapezius and shoulder pain that began in September 2019. Patient is unsure of cause of pain but stated it may be due to dove shooting. 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 Patient reports infrequent neurological symptoms down right UE to 5th finger. Patient noted with increased difficulties with cervical extension as well as rotation. Patient reports pain at worst can reach "moderate" pain; pain at best is 0/10 with laying down. Patient's goals are to decrease pain and improve movement.    Patient  is accompained by:  Family member   Wife, Micheal Mosley   Limitations  Lifting    Diagnostic tests  X-Ray: "arthritis in neck"    Patient Stated Goals  decrease pain and return to work activities with no pain     Currently in Pain?  Yes    Pain Score  4     Pain Location  Neck    Pain Orientation  Posterior    Pain Descriptors / Indicators  Sore;Dull;Nagging    Pain Type  Chronic pain;Acute pain    Pain Onset  More than a month ago    Pain Frequency  Intermittent    Aggravating Factors   driving, looking up    Pain Relieving Factors  laying down         [facility] PT Assessment - 06/15/18 0001      Assessment   Medical Diagnosis  Cervical Spondylois C4-C5, C5-C6    Referring Provider (PT)  Vira Browns, MD    Onset Date/Surgical Date  --   September 2019   Next MD Visit  6 weeks     Prior Therapy  no      Precautions   Precautions  None      Balance Screen   Has the patient fallen in the past 6 months  No    Has the patient had a decrease in activity level because of a  fear of falling?   No    Is the patient reluctant to leave their home because of a fear of falling?   No      Home Environment   Living Environment  Private residence    Living Arrangements  Spouse/significant other      Prior Function   Level of Independence  Independent      Posture/Postural Control   Posture/Postural Control  Postural limitations    Postural Limitations  Rounded Shoulders;Forward head;Increased thoracic kyphosis      ROM / Strength   AROM / PROM / Strength  AROM;Strength      AROM   Overall AROM   Due to pain    AROM Assessment Site  Cervical    Cervical Flexion  60    Cervical Extension  32    Cervical - Right Side Bend  26    Cervical - Left Side Bend  22    Cervical - Right Rotation  65    Cervical - Left Rotation  72      Strength   Strength Assessment Site  Shoulder    Right/Left Shoulder  Right    Right Shoulder Flexion  4+/5    Right Shoulder ABduction  4+/5    Right  Shoulder Internal Rotation  4+/5    Right Shoulder External Rotation  5/5      Palpation   Palpation comment  Tenderness to palpation to right cervical paraspinals, R UT, and levator scapulae insertion.       Special Tests    Special Tests  Cervical    Cervical Tests  Dictraction      Distraction Test   Findngs  Positive    Comment  decrease of symptoms down R UE                Objective measurements completed on examination: See above findings.              PT Education - 06/15/18 2053    Education Details  chin tucks, scapular retractoins, R UT stretch    Person(s) Educated  Patient;Spouse    Methods  Explanation;Demonstration;Handout    Comprehension  Returned demonstration;Verbalized understanding          PT Long Term Goals - 06/15/18 2055      PT LONG TERM GOAL #1   Title  Patient will be indepenent with HEP    Time  6    Period  Weeks    Status  New      PT LONG TERM GOAL #2   Title  Patient will improve cervical rotation to 70+ degrees to improve ability look in blindespot while driving.    Time  6    Period  Weeks    Status  New      PT LONG TERM GOAL #3   Title  Patient will report centralization of neruological symptoms to demonstrate decreased nerve irritation.     Time  6    Period  Weeks    Status  New             Plan - 06/15/18 2104    Clinical Impression Statement  Patient is a 67 year old male who presents to physical therapy with decreased cervical AROM and cervical and right UT pain. Patient noted with [facility] shoulder AROM and good plus shoulder MMMT. Patient noted with increased tenderness to palpation to right cervical paraspinals, right UT and right levator scapulae. Patient (+)  for distraction test with a decrease in symptoms down R UE. Patient noted with rounded head, forward shoulders and increased lumbar lordosis. Patient would benefit from skilled physical therapy to address deficits and address patient's goals.     Clinical Presentation  Evolving    Clinical Presentation due to:  not improving    Clinical Decision Making  Low    Rehab Potential  Good    PT Frequency  2x / week    PT Duration  6 weeks    PT Treatment/Interventions  ADLs/Self Care Home Management;Iontophoresis 4mg /ml Dexamethasone;Moist Heat;Traction;Ultrasound;Electrical Stimulation;Cryotherapy;Therapeutic exercise;Therapeutic activities;Neuromuscular re-education;Manual techniques;Passive range of motion;Patient/family education;Taping    PT Next Visit Plan  UBE postural exercises, start with manual traction the progress to mechanical traction. modalities PRN for pain relief.    PT Home Exercise Plan  see patient education section    Consulted and Agree with Plan of Care  Patient       Patient will benefit from skilled therapeutic intervention in order to improve the following deficits and impairments:  Pain, Decreased range of motion, Decreased strength, Postural dysfunction  Visit Diagnosis: Cervicalgia - Plan: PT plan of care cert/re-cert  Radiculopathy, cervical region - Plan: PT plan of care cert/re-cert     Problem List Patient Active Problem List   Diagnosis Date Noted  . Cervical spondylosis 06/15/2018  . Spinal stenosis, lumbar region, with neurogenic claudication 10/29/2015    Class: Chronic   Guss Bunde, PT, DPT 06/15/2018, 9:14 PM  [facility] [address] [place], [place], [address] Phone: [phone]   Fax:  [phone]  Name: Micheal Mosley MRN: [id number] Date of Birth: 05-16-1951

## 2018-06-22 ENCOUNTER — Encounter: Payer: Self-pay | Admitting: Physical Therapy

## 2018-06-22 ENCOUNTER — Ambulatory Visit: Payer: Medicare Other | Admitting: Physical Therapy

## 2018-06-22 DIAGNOSIS — M542 Cervicalgia: Secondary | ICD-10-CM

## 2018-06-22 DIAGNOSIS — M5412 Radiculopathy, cervical region: Secondary | ICD-10-CM

## 2018-06-22 NOTE — Therapy (Signed)
[facility] [place], [place], [address] Phone: [phone]   Fax:  [phone]  Physical Therapy Treatment  Patient Details  Name: Noboru Bidinger MRN: [id number] Date of Birth: 12-22-1950 Referring Provider (PT): Basil Dess, MD   Encounter Date: 06/22/2018  PT End of Session - 06/22/18 1556    Visit Number  2    Number of Visits  12    Date for PT Re-Evaluation  08/03/18    Authorization Type  Progress note every 10th visits; KX modifier after 15th visit    PT Start Time  1554    PT Stop Time  1648    PT Time Calculation (min)  54 min    Activity Tolerance  Patient tolerated treatment well    Behavior During Therapy  [facility] for tasks assessed/performed       Past Medical History:  Diagnosis Date  . Arthritis   . Chronic kidney disease    h/o kidney stone    Past Surgical History:  Procedure Laterality Date  . LUMBAR LAMINECTOMY/DECOMPRESSION MICRODISCECTOMY N/A 10/29/2015   Procedure: L4-5 DECOMPRESSION;  Surgeon: Jessy Oto, MD;  Location: [facility];  Service: Orthopedics;  Laterality: N/A;  . TRIGGER FINGER RELEASE     on the right hand    There were no vitals filed for this visit.  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 Reports that its only an aching intermittantly. Reports doing his HEP but probably not as often as he should.    Limitations  Lifting    Diagnostic tests  X-Ray: "arthritis in neck"    Patient Stated Goals  decrease pain and return to work activities with no pain     Currently in Pain?  Yes    Pain Score  3     Pain Location  Neck    Pain Orientation  Left    Pain Descriptors / Indicators  Aching    Pain Type  Chronic pain    Pain Onset  More than a month ago    Pain Frequency  Intermittent         [facility] PT Assessment - 06/22/18 0001      Assessment   Medical Diagnosis  Cervical Spondylois C4-C5, C5-C6     Referring Provider (PT)  Basil Dess, MD    Next MD Visit  6 weeks     Prior Therapy  no      Precautions   Precautions  None                   [facility] Adult PT Treatment/Exercise - 06/22/18 0001      Exercises   Exercises  Neck      Neck Exercises: Machines for Strengthening   UBE (Upper Arm Bike)  120 RPM x6 min      Neck Exercises: Theraband   Shoulder Extension  20 reps;Limitations    Shoulder Extension Limitations  Pink XTS    Rows  20 reps;Limitations    Rows Limitations  Pink XTS    Shoulder External Rotation  20 reps;Red    Horizontal ABduction  20 reps;Red      Modalities   Modalities  Traction;Ultrasound      Ultrasound   Ultrasound Location  R UT    Ultrasound Parameters  1.5 w/cm2, 100%, 1 mhz x10 min    Ultrasound Goals  Pain  Traction   Type of Traction  Cervical    Min (lbs)  5    Max (lbs)  15    Hold Time  99    Rest Time  5    Time  15      Manual Therapy   Manual Therapy  Soft tissue mobilization    Soft tissue mobilization  STW to R UT, levator scapula, mid trap to reduce pain and tightness                  PT Long Term Goals - 06/22/18 1654      PT LONG TERM GOAL #1   Title  Patient will be indepenent with HEP    Time  6    Period  Weeks    Status  Partially Met      PT LONG TERM GOAL #2   Title  Patient will improve cervical rotation to 70+ degrees to improve ability look in blindespot while driving.    Time  6    Period  Weeks    Status  On-going      PT LONG TERM GOAL #3   Title  Patient will report centralization of neruological symptoms to demonstrate decreased nerve irritation.     Time  6    Period  Weeks    Status  On-going            Plan - 06/22/18 1643    Clinical Impression Statement  Patient presented in clinic with reports of minimal discomfort especially in R UT region. Patient guided through initially with postural strengthening exercises and VCs to maintain good head posture as  well. Patient educated regarding the importance of postural strengthening and to improve overall posture and pain. Patient also encouraged by PTA to continue HEP as directed to further improve his function and lessen pain. Patient did mention throughout exercises of "feeling it" in R UT region thus [place] and STW completed to R UT, mid trap, levator scapula. Normal [place] response noted following by STW with greater tightness noted in UT/levator scapula region as well as mid trap. Patient especially experienced tenderness in R mid trap musclature. Mechanical traction also intitated at 15# max with patient reporting no increased pain following end of treatment.    Rehab Potential  Good    PT Frequency  2x / week    PT Duration  6 weeks    PT Treatment/Interventions  ADLs/Self Care Home Management;Iontophoresis '4mg'$ /ml Dexamethasone;Moist Heat;Traction;Ultrasound;Electrical Stimulation;Cryotherapy;Therapeutic exercise;Therapeutic activities;Neuromuscular re-education;Manual techniques;Passive range of motion;Patient/family education;Taping    PT Next Visit Plan  UBE postural exercises, start with manual traction the progress to mechanical traction. modalities PRN for pain relief.    PT Home Exercise Plan  see patient education section    Consulted and Agree with Plan of Care  Patient       Patient will benefit from skilled therapeutic intervention in order to improve the following deficits and impairments:  Pain, Decreased range of motion, Decreased strength, Postural dysfunction  Visit Diagnosis: Cervicalgia  Radiculopathy, cervical region     Problem List Patient Active Problem List   Diagnosis Date Noted  . Cervical spondylosis 06/15/2018  . Spinal stenosis, lumbar region, with neurogenic claudication 10/29/2015    Class: Chronic    Standley Brooking, PTA 06/22/2018, 4:54 PM  [facility] [place], [place], [address] Phone: [phone]    Fax:  [phone]  Name: Daxx Tiggs MRN: [id number] Date of Birth:  01/15/1951   

## 2018-06-24 ENCOUNTER — Ambulatory Visit: Payer: Medicare Other | Admitting: Physical Therapy

## 2018-06-24 ENCOUNTER — Encounter: Payer: Self-pay | Admitting: Physical Therapy

## 2018-06-24 DIAGNOSIS — M5412 Radiculopathy, cervical region: Secondary | ICD-10-CM

## 2018-06-24 DIAGNOSIS — M542 Cervicalgia: Secondary | ICD-10-CM

## 2018-06-24 NOTE — Therapy (Signed)
[facility] [place], [place], [address] Phone: [phone]   Fax:  [phone]  Physical Therapy Treatment  Patient Details  Name: Micheal Mosley MRN: [id number] Date of Birth: [birth year] Referring Provider (PT): Basil Dess, MD   Encounter Date: 06/24/2018  PT End of Session - 06/24/18 1610    Visit Number  3    Number of Visits  12    Date for PT Re-Evaluation  08/03/18    Authorization Type  Progress note every 10th visits; KX modifier after 15th visit    PT Start Time  1601    PT Stop Time  1645    PT Time Calculation (min)  44 min    Activity Tolerance  Patient tolerated treatment well    Behavior During Therapy  [facility] for tasks assessed/performed       Past Medical History:  Diagnosis Date  . Arthritis   . Chronic kidney disease    h/o kidney stone    Past Surgical History:  Procedure Laterality Date  . LUMBAR LAMINECTOMY/DECOMPRESSION MICRODISCECTOMY N/A 10/29/2015   Procedure: L4-5 DECOMPRESSION;  Surgeon: Jessy Oto, MD;  Location: [facility];  Service: Orthopedics;  Laterality: N/A;  . TRIGGER FINGER RELEASE     on the right hand    There were no vitals filed for this visit.  Subjective Assessment - 06/24/18 1606    Subjective  Reports that traction made his neck feel better.    Limitations  Lifting    Diagnostic tests  X-Ray: "arthritis in neck"    Patient Stated Goals  decrease pain and return to work activities with no pain     Currently in Pain?  Yes    Pain Score  3     Pain Location  Neck    Pain Orientation  Right    Pain Descriptors / Indicators  Discomfort    Pain Type  Chronic pain    Pain Onset  More than a month ago    Pain Frequency  Intermittent         [facility] PT Assessment - 06/24/18 0001      Assessment   Medical Diagnosis  Cervical Spondylois C4-C5, C5-C6    Referring Provider (PT)  Basil Dess, MD    Next MD Visit  6 weeks     Prior Therapy  no      Precautions   Precautions  None                   [facility] Adult PT Treatment/Exercise - 06/24/18 0001      Exercises   Exercises  Neck      Neck Exercises: Machines for Strengthening   UBE (Upper Arm Bike)  120 RPM x6 min      Neck Exercises: Theraband   Shoulder Extension  20 reps;Limitations    Shoulder Extension Limitations  Pink XTS    Rows  20 reps;Limitations    Rows Limitations  Pink XTS    Shoulder External Rotation  20 reps;Red    Horizontal ABduction  20 reps;Red    Other Theraband Exercises  B D2 red theraband x15 reps each      Neck Exercises: Standing   Wall Push Ups  20 reps      Modalities   Modalities  Traction      Traction   Type of Traction  Cervical    Min (lbs)  5    Max (lbs)  17  Hold Time  99    Rest Time  5    Time  15      Manual Therapy   Manual Therapy  Soft tissue mobilization    Manual therapy comments  `    Soft tissue mobilization  STW to R UT, levator scapula, mid trap to reduce pain and tightness                  PT Long Term Goals - 06/22/18 1654      PT LONG TERM GOAL #1   Title  Patient will be indepenent with HEP    Time  6    Period  Weeks    Status  Partially Met      PT LONG TERM GOAL #2   Title  Patient will improve cervical rotation to 70+ degrees to improve ability look in blindespot while driving.    Time  6    Period  Weeks    Status  On-going      PT LONG TERM GOAL #3   Title  Patient will report centralization of neruological symptoms to demonstrate decreased nerve irritation.     Time  6    Period  Weeks    Status  On-going            Plan - 06/24/18 1634    Clinical Impression Statement  Patient presented in clinic with only minimal complaints of cervical pain. Patient able to complete all exercises as directed and no complaints of any increased pain. Patient was notably more weak with RUE D2 strengthening with red theraband. Minimal tightness palpated in R UT, mid trap, levator scapula  region. Traction increased to max of 17# per great response following previous traction. Patient reported only minimal soreness following end of treatment.    Rehab Potential  Good    PT Frequency  2x / week    PT Duration  6 weeks    PT Treatment/Interventions  ADLs/Self Care Home Management;Iontophoresis '4mg'$ /ml Dexamethasone;Moist Heat;Traction;Ultrasound;Electrical Stimulation;Cryotherapy;Therapeutic exercise;Therapeutic activities;Neuromuscular re-education;Manual techniques;Passive range of motion;Patient/family education;Taping    PT Next Visit Plan  Continue postural strengthening along with cervical traction.    PT Home Exercise Plan  see patient education section    Consulted and Agree with Plan of Care  Patient       Patient will benefit from skilled therapeutic intervention in order to improve the following deficits and impairments:  Pain, Decreased range of motion, Decreased strength, Postural dysfunction  Visit Diagnosis: Cervicalgia  Radiculopathy, cervical region     Problem List Patient Active Problem List   Diagnosis Date Noted  . Cervical spondylosis 06/15/2018  . Spinal stenosis, lumbar region, with neurogenic claudication 10/29/2015    Class: Chronic    Standley Brooking, PTA 06/24/2018, 4:51 PM  [facility] [place], [place], [address] Phone: [phone]   Fax:  [phone]  Name: Yahel Fuston MRN: [id number] Date of Birth: 06-30-1951

## 2018-06-29 ENCOUNTER — Ambulatory Visit: Payer: Medicare Other | Admitting: *Deleted

## 2018-06-29 DIAGNOSIS — M5412 Radiculopathy, cervical region: Secondary | ICD-10-CM

## 2018-06-29 DIAGNOSIS — M542 Cervicalgia: Secondary | ICD-10-CM | POA: Diagnosis not present

## 2018-06-29 NOTE — Therapy (Signed)
[facility] [place], [place], [address] Phone: [phone]   Fax:  [phone]  Physical Therapy Treatment  Patient Details  Name: Micheal Mosley MRN: [id number] Date of Birth: 02-Jul-1951 Referring Provider (PT): Basil Dess, MD   Encounter Date: 06/29/2018  PT End of Session - 06/29/18 1636    Visit Number  4    Number of Visits  12    Date for PT Re-Evaluation  08/03/18    Authorization Type  Progress note every 10th visits; KX modifier after 15th visit    PT Start Time  1600    PT Stop Time  1650    PT Time Calculation (min)  50 min    Activity Tolerance  Patient tolerated treatment well    Behavior During Therapy  [facility] for tasks assessed/performed       Past Medical History:  Diagnosis Date  . Arthritis   . Chronic kidney disease    h/o kidney stone    Past Surgical History:  Procedure Laterality Date  . LUMBAR LAMINECTOMY/DECOMPRESSION MICRODISCECTOMY N/A 10/29/2015   Procedure: L4-5 DECOMPRESSION;  Surgeon: Jessy Oto, MD;  Location: [facility];  Service: Orthopedics;  Laterality: N/A;  . TRIGGER FINGER RELEASE     on the right hand    There were no vitals filed for this visit.  Subjective Assessment - 06/29/18 1600    Subjective  Traction and Rxs are helping    Patient is accompained by:  Family member    Limitations  Lifting    Diagnostic tests  X-Ray: "arthritis in neck"    Patient Stated Goals  decrease pain and return to work activities with no pain     Currently in Pain?  Yes    Pain Score  3     Pain Orientation  Right    Pain Descriptors / Indicators  Discomfort    Pain Type  Chronic pain    Pain Onset  More than a month ago    Pain Frequency  Intermittent                       [facility] Adult PT Treatment/Exercise - 06/29/18 0001      Exercises   Exercises  Neck      Neck Exercises: Machines for Strengthening   UBE (Upper Arm Bike)  120 RPM x6 min      Neck Exercises:  Theraband   Shoulder Extension  10 reps;20 reps    Shoulder Extension Limitations  Pink XTS    Rows  Limitations   4x10   Rows Limitations  Pink XTS    Shoulder External Rotation  20 reps;Red   2 x 20 cues for technique     Modalities   Modalities  Traction      Traction   Type of Traction  Cervical    Min (lbs)  5    Max (lbs)  19    Hold Time  99    Rest Time  5    Time  15      Manual Therapy   Manual Therapy  Soft tissue mobilization    Soft tissue mobilization  STW to R UT, levator scapula, mid trap to reduce pain and tightness                  PT Long Term Goals - 06/22/18 1654      PT LONG TERM GOAL #1   Title  Patient  will be indepenent with HEP    Time  6    Period  Weeks    Status  Partially Met      PT LONG TERM GOAL #2   Title  Patient will improve cervical rotation to 70+ degrees to improve ability look in blindespot while driving.    Time  6    Period  Weeks    Status  On-going      PT LONG TERM GOAL #3   Title  Patient will report centralization of neruological symptoms to demonstrate decreased nerve irritation.     Time  6    Period  Weeks    Status  On-going            Plan - 06/29/18 1627    Clinical Impression Statement  Pt arrives today doing fairly well and reports progression in PT with less pain now. He was able to complete all therex for postural strengthening and traction was performed at 19#s and did well.    Clinical Presentation  Evolving    Clinical Decision Making  Low    Rehab Potential  Good    PT Frequency  2x / week    PT Duration  6 weeks    PT Treatment/Interventions  ADLs/Self Care Home Management;Iontophoresis '4mg'$ /ml Dexamethasone;Moist Heat;Traction;Ultrasound;Electrical Stimulation;Cryotherapy;Therapeutic exercise;Therapeutic activities;Neuromuscular re-education;Manual techniques;Passive range of motion;Patient/family education;Taping    PT Next Visit Plan  Continue postural strengthening along with  cervical traction.    PT Home Exercise Plan  see patient education section    Consulted and Agree with Plan of Care  Patient       Patient will benefit from skilled therapeutic intervention in order to improve the following deficits and impairments:  Pain, Decreased range of motion, Decreased strength, Postural dysfunction  Visit Diagnosis: Cervicalgia  Radiculopathy, cervical region     Problem List Patient Active Problem List   Diagnosis Date Noted  . Cervical spondylosis 06/15/2018  . Spinal stenosis, lumbar region, with neurogenic claudication 10/29/2015    Class: Chronic    Sharnette Kitamura,CHRIS, PTA 06/29/2018, 4:56 PM  [facility] [address] [place], [place], [address] Phone: [phone]   Fax:  [phone]  Name: Micheal Mosley MRN: [id number] Date of Birth: 05/20/1951

## 2018-07-01 ENCOUNTER — Ambulatory Visit: Payer: Medicare Other | Admitting: Physical Therapy

## 2018-07-01 DIAGNOSIS — M542 Cervicalgia: Secondary | ICD-10-CM

## 2018-07-01 DIAGNOSIS — M5412 Radiculopathy, cervical region: Secondary | ICD-10-CM

## 2018-07-01 NOTE — Therapy (Signed)
[facility] [place], [place], [address] Phone: [phone]   Fax:  [phone]  Physical Therapy Treatment/Discharge  Patient Details  Name: Micheal Mosley MRN: [id number] Date of Birth: 10-16-1950 Referring Provider (PT): Basil Dess, MD   Encounter Date: 07/01/2018  PT End of Session - 07/01/18 1612    Visit Number  5    Number of Visits  12    Date for PT Re-Evaluation  08/03/18    Authorization Type  Progress note every 10th visits; KX modifier after 15th visit    PT Start Time  1600    PT Stop Time  1638    PT Time Calculation (min)  38 min    Activity Tolerance  Patient tolerated treatment well    Behavior During Therapy  [facility] for tasks assessed/performed       Past Medical History:  Diagnosis Date  . Arthritis   . Chronic kidney disease    h/o kidney stone    Past Surgical History:  Procedure Laterality Date  . LUMBAR LAMINECTOMY/DECOMPRESSION MICRODISCECTOMY N/A 10/29/2015   Procedure: L4-5 DECOMPRESSION;  Surgeon: Jessy Oto, MD;  Location: [facility];  Service: Orthopedics;  Laterality: N/A;  . TRIGGER FINGER RELEASE     on the right hand    There were no vitals filed for this visit.  Subjective Assessment - 07/01/18 1803    Subjective  Reports he feels better and requests to discharge.    Limitations  Lifting    Diagnostic tests  X-Ray: "arthritis in neck"    Patient Stated Goals  decrease pain and return to work activities with no pain     Currently in Pain?  No/denies         [facility] PT Assessment - 07/01/18 0001      Assessment   Medical Diagnosis  Cervical Spondylois C4-C5, C5-C6    Referring Provider (PT)  Basil Dess, MD    Next MD Visit  6 weeks     Prior Therapy  no      Precautions   Precautions  None      ROM / Strength   AROM / PROM / Strength  AROM      AROM   Overall AROM   Within functional limits for tasks performed;Deficits    AROM Assessment Site  Cervical    Cervical -  Right Rotation  65    Cervical - Left Rotation  70                   [facility] Adult PT Treatment/Exercise - 07/01/18 0001      Exercises   Exercises  Neck      Neck Exercises: Machines for Strengthening   UBE (Upper Arm Bike)  90 RPM x8 min      Neck Exercises: Theraband   Shoulder Extension  20 reps;10 reps;Limitations    Shoulder Extension Limitations  Pink XTS    Rows  20 reps;10 reps;Limitations    Rows Limitations  Pink XTS    Shoulder External Rotation  20 reps;Red    Horizontal ABduction  20 reps;Red      Neck Exercises: Standing   Wall Push Ups  20 reps      Modalities   Modalities  Traction      Traction   Type of Traction  Cervical    Min (lbs)  5    Max (lbs)  19    Hold Time  99  Rest Time  5    Time  15                  PT Long Term Goals - 07/01/18 1623      PT LONG TERM GOAL #1   Title  Patient will be indepenent with HEP    Time  6    Period  Weeks    Status  Partially Met      PT LONG TERM GOAL #2   Title  Patient will improve cervical rotation to 70+ degrees to improve ability look in blindespot while driving.    Time  6    Period  Weeks    Status  Partially Met      PT LONG TERM GOAL #3   Title  Patient will report centralization of neruological symptoms to demonstrate decreased nerve irritation.     Time  6    Period  Weeks    Status  On-going            Plan - 07/01/18 1646    Clinical Impression Statement  Patient tolerated today's treatment well and requested to discharge from PT as he feels 75% better. Patient able to complete all exercises well with VCs for postural awareness. Patient unable to demonstrate improvement with cervical ROM at this time. Goals were mostly partially achieved but from patient reports only intermittant discomfort in R UT not necessarily neurological. Normal traction response noted at 19#. No complaints following end of treatment.     Rehab Potential  Good    PT Frequency  2x /  week    PT Duration  6 weeks    PT Treatment/Interventions  ADLs/Self Care Home Management;Iontophoresis '4mg'$ /ml Dexamethasone;Moist Heat;Traction;Ultrasound;Electrical Stimulation;Cryotherapy;Therapeutic exercise;Therapeutic activities;Neuromuscular re-education;Manual techniques;Passive range of motion;Patient/family education;Taping    PT Next Visit Plan  Continue postural strengthening along with cervical traction.    PT Home Exercise Plan  see patient education section    Consulted and Agree with Plan of Care  Patient       Patient will benefit from skilled therapeutic intervention in order to improve the following deficits and impairments:  Pain, Decreased range of motion, Decreased strength, Postural dysfunction  Visit Diagnosis: Cervicalgia  Radiculopathy, cervical region     Problem List Patient Active Problem List   Diagnosis Date Noted  . Cervical spondylosis 06/15/2018  . Spinal stenosis, lumbar region, with neurogenic claudication 10/29/2015    Class: Chronic    Standley Brooking, PTA 07/01/18 6:05 PM   [facility] [place], [place], [address] Phone: [phone]   Fax:  [phone]  Name: Micheal Mosley MRN: [id number] Date of Birth: 1951/04/21  PHYSICAL THERAPY DISCHARGE SUMMARY  Visits from Start of Care: 5  Current functional level related to goals / functional outcomes: See above   Remaining deficits: See goals   Education / Equipment: HEP Plan: Patient agrees to discharge.  Patient goals were partially met. Patient is being discharged due to the patient's request.  ?????

## 2020-06-25 ENCOUNTER — Ambulatory Visit (INDEPENDENT_AMBULATORY_CARE_PROVIDER_SITE_OTHER): Payer: Medicare Other

## 2020-06-25 ENCOUNTER — Ambulatory Visit (INDEPENDENT_AMBULATORY_CARE_PROVIDER_SITE_OTHER): Payer: Medicare Other | Admitting: Specialist

## 2020-06-25 ENCOUNTER — Encounter: Payer: Self-pay | Admitting: Specialist

## 2020-06-25 ENCOUNTER — Other Ambulatory Visit: Payer: Self-pay

## 2020-06-25 VITALS — BP 161/102 | HR 85 | Ht 69.0 in | Wt 153.0 lb

## 2020-06-25 DIAGNOSIS — M47812 Spondylosis without myelopathy or radiculopathy, cervical region: Secondary | ICD-10-CM

## 2020-06-25 DIAGNOSIS — M25511 Pain in right shoulder: Secondary | ICD-10-CM

## 2020-06-25 DIAGNOSIS — M4722 Other spondylosis with radiculopathy, cervical region: Secondary | ICD-10-CM

## 2020-06-25 MED ORDER — METHYLPREDNISOLONE 4 MG PO TBPK: ORAL_TABLET | ORAL | 0 refills | Status: DC

## 2020-06-25 NOTE — Progress Notes (Signed)
   Office Visit Note   Patient: Micheal Mosley           Date of Birth: [birth year]           MRN: [id number] Visit Date: 06/25/2020              Requested by: Gareth Morgan, MD [address]  [place] [address] PCP: Gareth Morgan, MD   Assessment & Plan: Visit Diagnoses:  1. Cervical spondylosis   2. Right shoulder pain, unspecified chronicity     Plan: Avoid overhead lifting and overhead use of the arms. Do not lift greater than 5 lbs. Adjust head rest in vehicle to prevent hyperextension if rear ended. Take extra precautions to avoid falling. .Avoid overhead lifting and overhead use of the arms. Pillows to keep from sleeping directly on the shoulders Limited lifting to less than 10 lbs. Ice or heat for relief. Do not take NSAIDs , such as alleve or motrin, be careful not to use in excess as they place burdens on the kidney and can reacitvate an ulcer. Medrol dose pak if an episode occurrs. Take according to the instructions.  Stretching exercise help and strengthening is helpful to build endurance.     Follow-Up Instructions: No follow-ups on file.   Orders:  Orders Placed This Encounter  Procedures  . XR Cervical Spine 2 or 3 views  . XR Shoulder Right   No orders of the defined types were placed in this encounter.     Procedures: No procedures performed   Clinical Data: No additional findings.   Subjective: Chief Complaint  Patient presents with  . Neck - Pain  . Right Shoulder - Pain    HPI  Review of Systems   Objective: Vital Signs: BP (!) 161/102 (BP Location: Left Arm, Patient Position: Sitting)   Pulse 85   Ht 5\' 9"  (1.753 m)   Wt 153 lb (69.4 kg)   BMI 22.59 kg/m   Physical Exam  Ortho Exam  Specialty Comments:  No specialty comments available.  Imaging: No results found.   PMFS History: Patient Active Problem List   Diagnosis Date Noted  . Spinal stenosis, lumbar region, with neurogenic claudication 10/29/2015      Priority: High    Class: Chronic  . Cervical spondylosis 06/15/2018   Past Medical History:  Diagnosis Date  . Arthritis   . Chronic kidney disease    h/o kidney stone    History reviewed. No pertinent family history.  Past Surgical History:  Procedure Laterality Date  . LUMBAR LAMINECTOMY/DECOMPRESSION MICRODISCECTOMY N/A 10/29/2015   Procedure: L4-5 DECOMPRESSION;  Surgeon: 10/31/2015, MD;  Location: [facility];  Service: Orthopedics;  Laterality: N/A;  . TRIGGER FINGER RELEASE     on the right hand   Social History   Occupational History  . Not on file  Tobacco Use  . Smoking status: Former Smoker    Packs/day: 0.50    Years: 8.00    Pack years: 4.00  . Smokeless tobacco: Former [facility]    Types: Chew    Quit date: 10/24/1983  Substance and Sexual Activity  . Alcohol use: Yes    Alcohol/week: 7.0 standard drinks    Types: 7 Shots of liquor per week    Comment: drinks rum  . Drug use: No  . Sexual activity: Not on file

## 2020-06-25 NOTE — Patient Instructions (Signed)
Plan: Avoid overhead lifting and overhead use of the arms. Do not lift greater than 5 lbs. Adjust head rest in vehicle to prevent hyperextension if rear ended. Take extra precautions to avoid falling. .Avoid overhead lifting and overhead use of the arms. Pillows to keep from sleeping directly on the shoulders Limited lifting to less than 10 lbs. Ice or heat for relief. Do not take NSAIDs , such as alleve or motrin, be careful not to use in excess as they place burdens on the kidney and can reacitvate an ulcer. Medrol dose pak if an episode occurrs. Take according to the instructions.  Stretching exercise help and strengthening is helpful to build endurance.

## 2022-12-25 ENCOUNTER — Other Ambulatory Visit (HOSPITAL_COMMUNITY)
Admission: RE | Admit: 2022-12-25 | Discharge: 2022-12-25 | Disposition: A | Discharge status: Home / Self Care | Payer: Medicare Other | Source: Ambulatory Visit | Attending: Family Medicine | Admitting: Family Medicine

## 2022-12-25 DIAGNOSIS — E785 Hyperlipidemia, unspecified: Secondary | ICD-10-CM | POA: Diagnosis present

## 2022-12-25 LAB — LIPID PANEL
Cholesterol: 224 mg/dL — ABNORMAL HIGH (ref 0–200)
HDL: 55 mg/dL (ref >40)
LDL Cholesterol: 152 mg/dL — ABNORMAL HIGH (ref 0–99)
Total CHOL/HDL Ratio: 4.1 RATIO
Triglycerides: 87 mg/dL (ref <150)
VLDL: 17 mg/dL (ref 0–40)

## 2023-06-25 ENCOUNTER — Encounter: Payer: Self-pay | Admitting: Family Medicine

## 2023-06-25 ENCOUNTER — Ambulatory Visit (INDEPENDENT_AMBULATORY_CARE_PROVIDER_SITE_OTHER): Payer: Medicare Other | Admitting: Family Medicine

## 2023-06-25 VITALS — BP 164/76 | HR 63 | Temp 98.5°F | Ht 69.0 in | Wt 166.0 lb

## 2023-06-25 DIAGNOSIS — E7800 Pure hypercholesterolemia, unspecified: Secondary | ICD-10-CM | POA: Diagnosis not present

## 2023-06-25 DIAGNOSIS — R251 Tremor, unspecified: Secondary | ICD-10-CM

## 2023-06-25 DIAGNOSIS — Z8042 Family history of malignant neoplasm of prostate: Secondary | ICD-10-CM

## 2023-06-25 DIAGNOSIS — M1990 Unspecified osteoarthritis, unspecified site: Secondary | ICD-10-CM

## 2023-06-25 DIAGNOSIS — Z23 Encounter for immunization: Secondary | ICD-10-CM

## 2023-06-25 DIAGNOSIS — Z7689 Persons encountering health services in other specified circumstances: Secondary | ICD-10-CM

## 2023-06-25 DIAGNOSIS — R030 Elevated blood-pressure reading, without diagnosis of hypertension: Secondary | ICD-10-CM

## 2023-06-25 DIAGNOSIS — Z87891 Personal history of nicotine dependence: Secondary | ICD-10-CM

## 2023-06-25 DIAGNOSIS — R7309 Other abnormal glucose: Secondary | ICD-10-CM

## 2023-06-25 LAB — BAYER DCA HB A1C WAIVED: HB A1C (BAYER DCA - WAIVED): 5.4 % (ref 4.8–5.6)

## 2023-06-25 NOTE — Patient Instructions (Signed)
Monitoring your BP at home   Your BP was elevated today in office  Please keep a log of your BP at home.  The best time to take BP is 1st thing in the morning after waking.  Sit for 5 minutes with feet flat on the floor, arm at heart level.  Options for BP cuffs are at [employer], [employer], [facility], [facility] & [facility].  We will review measurements at follow up and determine plan for BP management.  If you have access, you can send a message in MyChart with your measurements prior to your follow up appointment.  If not, please call or bring in measurements.

## 2023-06-25 NOTE — Progress Notes (Signed)
New Patient Office Visit  Subjective   Patient ID: Micheal Mosley, male    DOB: 1950/10/30  Age: 72 y.o. MRN: [id number]  CC:  Chief Complaint  Patient presents with   New Patient (Initial Visit)    Establish care Arthritis cholesterol   HPI Micheal Mosley presents to establish care. Formally established with [facility]. States that he was last seen 6-12 months ago. States that he has a history of HLD, arthritis.  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 Most meals are farm to table. Continues to work. 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 He does not know any other family history as his family moved to [place] from [place] and the [place] without records.  Of note, he complains today of a tremor in bilateral hands. States that it is slightly worse in his left hand. He does not have issues with writing or working. Denies trouble walking, falls, imbalance.  Quit smoking 20 years ago, quit chewing tobacco ~15 years ago.  Takes meloxicam for arthritis. States that it is well controlled with current medication.   Lipid/Cholesterol, Follow-up  Last lipid panel Other pertinent labs  Lab Results  Component Value Date   CHOL 224 (H) 12/25/2022   HDL 55 12/25/2022   LDLCALC 152 (H) 12/25/2022   TRIG 87 12/25/2022   CHOLHDL 4.1 12/25/2022   Lab Results  Component Value Date   ALT 30 10/24/2015   AST 35 10/24/2015   PLT 242 10/24/2015     He was last seen for this 6 months ago.  Management since that visit includes pravastatin.  He reports good compliance with treatment. He is not having side effects.   Symptoms: No chest pain No chest pressure/discomfort  No dyspnea No lower extremity edema  No numbness or tingling of extremity No orthopnea  No palpitations No paroxysmal nocturnal dyspnea  No speech  difficulty No syncope   Current diet: well balanced farms. Skips breakfast, sandwich for lunch. 3 course meal at supper.  Current exercise: yard work   The 10-year ASCVD risk score (Arnett DK, et al., 2019) is: 30.3%  ---------------------------------------------------------------------------------------------------   Outpatient Encounter Medications as of 06/25/2023  Medication Sig   meloxicam (MOBIC) 15 MG tablet    pravastatin (PRAVACHOL) 80 MG tablet Take 80 mg by mouth every evening.   acetaminophen (TYLENOL) 650 MG CR tablet Take 1,300 mg by mouth every 8 (eight) hours as needed for pain.   [DISCONTINUED] celecoxib (CELEBREX) 200 MG capsule Take 200 mg by mouth daily.   [DISCONTINUED] HYDROcodone-acetaminophen (NORCO) 5-325 MG tablet Take 1-2 tablets by mouth every 6 (six) hours as needed for moderate pain. MAXIMUM TOTAL ACETAMINOPHEN DOSE IS 4000 MG PER DAY   [DISCONTINUED] methocarbamol (ROBAXIN) 500 MG tablet Take 1 tablet (500 mg total) by mouth every 8 (eight) hours as needed for muscle spasms.   [DISCONTINUED] methylPREDNISolone (MEDROL DOSEPAK) 4 MG TBPK tablet Take as directed.   [DISCONTINUED] methylPREDNISolone (MEDROL DOSEPAK) 4 MG TBPK tablet Take as directed,  Day dose pak.   No facility-administered encounter medications on file as of 06/25/2023.    Past Medical History:  Diagnosis Date   Arthritis    Chronic kidney disease    h/o kidney stone    Past Surgical History:  Procedure Laterality Date   LUMBAR LAMINECTOMY/DECOMPRESSION MICRODISCECTOMY N/A 10/29/2015  Procedure: L4-5 DECOMPRESSION;  Surgeon: Kerrin Champagne, MD;  Location: [facility] OR;  Service: Orthopedics;  Laterality: N/A;   TRIGGER FINGER RELEASE     on the right hand   History reviewed. No pertinent family history.  Social History   Socioeconomic History   Marital status: Married    Spouse name: Not on file   Number of children: Not on file   Years of education: Not on file   Highest education  level: Not on file  Occupational History   Not on file  Tobacco Use   Smoking status: Former    Current packs/day: 0.50    Average packs/day: 0.5 packs/day for 8.0 years (4.0 ttl pk-yrs)    Types: Cigarettes   Smokeless tobacco: Former    Types: Chew    Quit date: 10/24/1983  Substance and Sexual Activity   Alcohol use: Yes    Alcohol/week: 7.0 standard drinks of alcohol    Types: 7 Shots of liquor per week    Comment: drinks rum   Drug use: No   Sexual activity: Not on file  Other Topics Concern   Not on file  Social History Narrative   Not on file   Social Determinants of Health   Financial Resource Strain: Not on file  Food Insecurity: Not on file  Transportation Needs: Not on file  Physical Activity: Not on file  Stress: Not on file  Social Connections: Not on file  Intimate Partner Violence: Not on file   ROS As per HPI  Objective   BP (!) 164/76   Pulse 63   Temp 98.5 F (36.9 C)   Ht 5\' 9"  (1.753 m)   Wt 166 lb (75.3 kg)   SpO2 98%   BMI 24.51 kg/m   Physical Exam Constitutional:      General: He is awake. He is not in acute distress.    Appearance: Normal appearance. He is well-developed, well-groomed and normal weight. He is not ill-appearing, toxic-appearing or diaphoretic.  Cardiovascular:     Rate and Rhythm: Normal rate and regular rhythm.     Pulses: Normal pulses.          Radial pulses are 2+ on the right side and 2+ on the left side.       Posterior tibial pulses are 2+ on the right side and 2+ on the left side.     Heart sounds: Normal heart sounds. No murmur heard.    No gallop.  Pulmonary:     Effort: Pulmonary effort is normal. No respiratory distress.     Breath sounds: Normal breath sounds. No stridor. No wheezing, rhonchi or rales.  Musculoskeletal:     Cervical back: Full passive range of motion without pain and neck supple.     Right lower leg: No edema.     Left lower leg: No edema.  Skin:    General: Skin is warm.      Capillary Refill: Capillary refill takes less than 2 seconds.  Neurological:     General: No focal deficit present.     Mental Status: He is alert, oriented to person, place, and time and easily aroused. Mental status is at baseline.     GCS: GCS eye subscore is 4. GCS verbal subscore is 5. GCS motor subscore is 6.     Motor: Tremor present. No weakness, atrophy, abnormal muscle tone, seizure activity or pronator drift.     Coordination: Romberg sign negative. Coordination normal.  Gait: Gait is intact. Gait and tandem walk normal.     Comments: Bilateral hand tremor. Greater with activity and intention. No associated head or facial tremor.   Psychiatric:        Attention and Perception: Attention and perception normal.        Mood and Affect: Mood and affect normal.        Speech: Speech normal.        Behavior: Behavior normal. Behavior is cooperative.        Thought Content: Thought content normal. Thought content does not include homicidal or suicidal ideation. Thought content does not include homicidal or suicidal plan.        Cognition and Memory: Cognition and memory normal.        Judgment: Judgment normal.       06/25/2023    2:27 PM  Depression screen PHQ 2/9  Decreased Interest 0  Down, Depressed, Hopeless 0  PHQ - 2 Score 0  Altered sleeping 0  Tired, decreased energy 0  Change in appetite 0  Feeling bad or failure about yourself  0  Trouble concentrating 0  Moving slowly or fidgety/restless 0  Suicidal thoughts 0  PHQ-9 Score 0  Difficult doing work/chores Not difficult at all      06/25/2023    2:28 PM  GAD 7 : Generalized Anxiety Score  Nervous, Anxious, on Edge 0  Control/stop worrying 0  Worry too much - different things 0  Trouble relaxing 0  Restless 0  Easily annoyed or irritable 0  Afraid - awful might happen 0  Total GAD 7 Score 0  Anxiety Difficulty Not difficult at all   Assessment & Plan:  Habram was seen today for new patient (initial  visit).  Diagnoses and all orders for this visit: 1. Encounter to establish care with new doctor Discussed with patient to continue healthy lifestyle choices, including diet (rich in fruits, vegetables, and lean proteins, and low in salt and simple carbohydrates) and exercise (at least 30 minutes of moderate physical activity daily). Limit beverages high is sugar. Recommended at least 80-100 oz of water daily.   2. Pure hypercholesterolemia Continue pravastatin.  Labs as below. Will communicate results to patient once available. Will await results to determine next steps.  Fasting  - Lipid panel  3. Tremor Discussed with patient that likely essential tremor given history. Labs as below. Will communicate results to patient once available. Will await results to determine next steps.  - TSH  4. Arthritis Well controlled continue current regimen. If patient is remaining on NSAIDs long term will start PPI for GI protection.   5. Elevated blood pressure reading Elevated BP today in office. Discussed with patient monitoring BP at home. Instructed pt to take BP first thing in the morning after sitting for 5 minutes with feet flat on the floor, arm at heart level. Discussed with patient options for BP cuff at [facility] [employer], [facility] & [facility]. Pt verbalized they were able to obtain BP cuff. Will review measurements with patient at follow up and determine plan for BP management.  - CBC with Differential/Platelet - CMP14+EGFR  6. Family history of prostate cancer - PSA  7. Former tobacco use Remote history of tobacco use. Based on guidelines, does not need to pursue screening for lung cancer at this time.   8. Elevated glucose level Reviewed labs from 10/24/2015. Elevated BG. Labs as below. Will communicate results to patient once available. Will await results to determine next  steps.  - Bayer DCA Hb A1c Waived  The above assessment and management plan was discussed with the patient. The patient  verbalized understanding of and has agreed to the management plan using shared-decision making. Patient is aware to call the clinic if they develop any new symptoms or if symptoms fail to improve or worsen. Patient is aware when to return to the clinic for a follow-up visit. Patient educated on when it is appropriate to go to the emergency department.   Return in about 3 months (around 09/25/2023) for BP follow up .   Neale Burly, DNP-FNP [facility] [address] [place], [place] [address] [phone]

## 2023-06-26 LAB — CMP14+EGFR
ALT: 19 [IU]/L (ref 0–44)
AST: 21 [IU]/L (ref 0–40)
Albumin: 4.6 g/dL (ref 3.8–4.8)
Alkaline Phosphatase: 85 [IU]/L (ref 44–121)
BUN/Creatinine Ratio: 21 (ref 10–24)
BUN: 19 mg/dL (ref 8–27)
Bilirubin Total: 0.3 mg/dL (ref 0.0–1.2)
CO2: 24 mmol/L (ref 20–29)
Calcium: 10 mg/dL (ref 8.6–10.2)
Chloride: 103 mmol/L (ref 96–106)
Creatinine, Ser: 0.92 mg/dL (ref 0.76–1.27)
Globulin, Total: 2.1 g/dL (ref 1.5–4.5)
Glucose: 80 mg/dL (ref 70–99)
Potassium: 4.3 mmol/L (ref 3.5–5.2)
Sodium: 141 mmol/L (ref 134–144)
Total Protein: 6.7 g/dL (ref 6.0–8.5)
eGFR: 88 mL/min/{1.73_m2} (ref >59)

## 2023-06-26 LAB — CBC WITH DIFFERENTIAL/PLATELET
Basophils Absolute: 0 10*3/uL (ref 0.0–0.2)
Basos: 0 %
EOS (ABSOLUTE): 0 10*3/uL (ref 0.0–0.4)
Eos: 0 %
Hematocrit: 41.4 % (ref 37.5–51.0)
Hemoglobin: 13.6 g/dL (ref 13.0–17.7)
Immature Grans (Abs): 0 10*3/uL (ref 0.0–0.1)
Immature Granulocytes: 0 %
Lymphocytes Absolute: 2.3 10*3/uL (ref 0.7–3.1)
Lymphs: 37 %
MCH: 31.2 pg (ref 26.6–33.0)
MCHC: 32.9 g/dL (ref 31.5–35.7)
MCV: 95 fL (ref 79–97)
Monocytes Absolute: 0.6 10*3/uL (ref 0.1–0.9)
Monocytes: 10 %
Neutrophils Absolute: 3.3 10*3/uL (ref 1.4–7.0)
Neutrophils: 53 %
Platelets: 270 10*3/uL (ref 150–450)
RBC: 4.36 x10E6/uL (ref 4.14–5.80)
RDW: 12.6 % (ref 11.6–15.4)
WBC: 6.3 10*3/uL (ref 3.4–10.8)

## 2023-06-26 LAB — LIPID PANEL
Chol/HDL Ratio: 3.8 ratio (ref 0.0–5.0)
Cholesterol, Total: 213 mg/dL — ABNORMAL HIGH (ref 100–199)
HDL: 56 mg/dL (ref >39)
LDL Chol Calc (NIH): 129 mg/dL — ABNORMAL HIGH (ref 0–99)
Triglycerides: 159 mg/dL — ABNORMAL HIGH (ref 0–149)
VLDL Cholesterol Cal: 28 mg/dL (ref 5–40)

## 2023-06-26 LAB — PSA: Prostate Specific Ag, Serum: 1.3 ng/mL (ref 0.0–4.0)

## 2023-06-26 LAB — TSH: TSH: 2 u[IU]/mL (ref 0.450–4.500)

## 2023-06-26 NOTE — Progress Notes (Signed)
Cholesterol is elevated. Diet encouraged - increase intake of fresh fruits and vegetables, increase intake of lean proteins. Bake, broil, or grill foods. Avoid fried, greasy, and fatty foods. Avoid fast foods. Increase intake of fiber-rich whole grains. Exercise encouraged - at least 150 minutes per week and advance as tolerated. Patient to continue pravastatin. Will repeat labs in 6 months. If not improved, recommend switching to crestor.   The 10-year ASCVD risk score (Arnett DK, et al., 2019) is: 29.5%   Values used to calculate the score:     Age: 72 years     Sex: Male     Is Non-Hispanic African American: No     Diabetic: No     Tobacco smoker: No     Systolic Blood Pressure: 164 mmHg     Is BP treated: No     HDL Cholesterol: 56 mg/dL     Total Cholesterol: 213 mg/dL

## 2023-07-30 ENCOUNTER — Ambulatory Visit: Payer: Medicare Other

## 2023-07-30 VITALS — Ht 69.0 in | Wt 166.0 lb

## 2023-07-30 DIAGNOSIS — Z0000 Encounter for general adult medical examination without abnormal findings: Secondary | ICD-10-CM

## 2023-07-30 NOTE — Progress Notes (Signed)
Subjective:   Micheal Mosley is a 72 y.o. male who presents for an Initial Medicare Annual Wellness Visit.  Visit Complete: Virtual I connected with  Micheal Mosley on 07/30/23 by a audio enabled telemedicine application and verified that I am speaking with the correct person using two identifiers.  Patient Location: Home  Provider Location: Office/Clinic  I discussed the limitations of evaluation and management by telemedicine. The patient expressed understanding and agreed to proceed.  Vital Signs: Because this visit was a virtual/telehealth visit, some criteria may be missing or patient reported. Any vitals not documented were not able to be obtained and vitals that have been documented are patient reported.  Cardiac Risk Factors include: advanced age (>35men, >48 women);male gender;dyslipidemia     Objective:    Today's Vitals   07/30/23 1439  Weight: 166 lb (75.3 kg)  Height: 5\' 9"  (1.753 m)   Body mass index is 24.51 kg/m.     07/30/2023    2:47 PM 06/15/2018    8:48 PM 10/29/2015    6:18 AM 10/24/2015    3:20 PM  Advanced Directives  Does Patient Have a Medical Advance Directive? No Yes Yes Yes  Type of Advance Directive   Living will   Does patient want to make changes to medical advance directive?   No - Patient declined   Copy of Healthcare Power of Attorney in Chart?   Yes   Would patient like information on creating a medical advance directive? Yes (MAU/Ambulatory/Procedural Areas - Information given)       Current Medications (verified) Outpatient Encounter Medications as of 07/30/2023  Medication Sig   acetaminophen (TYLENOL) 650 MG CR tablet Take 1,300 mg by mouth every 8 (eight) hours as needed for pain.   meloxicam (MOBIC) 15 MG tablet    pravastatin (PRAVACHOL) 80 MG tablet Take 80 mg by mouth every evening.   No facility-administered encounter medications on file as of 07/30/2023.    Allergies (verified) Patient has no known allergies.    History: Past Medical History:  Diagnosis Date   Arthritis    Chronic kidney disease    h/o kidney stone   Past Surgical History:  Procedure Laterality Date   LUMBAR LAMINECTOMY/DECOMPRESSION MICRODISCECTOMY N/A 10/29/2015   Procedure: L4-5 DECOMPRESSION;  Surgeon: Kerrin Champagne, MD;  Location: [facility] OR;  Service: Orthopedics;  Laterality: N/A;   TRIGGER FINGER RELEASE     on the right hand   History reviewed. No pertinent family history. Social History   Socioeconomic History   Marital status: Married    Spouse name: Not on file   Number of children: Not on file   Years of education: Not on file   Highest education level: Not on file  Occupational History   Not on file  Tobacco Use   Smoking status: Former    Current packs/day: 0.50    Average packs/day: 0.5 packs/day for 8.0 years (4.0 ttl pk-yrs)    Types: Cigarettes   Smokeless tobacco: Former    Types: Chew    Quit date: 10/24/1983  Substance and Sexual Activity   Alcohol use: Yes    Alcohol/week: 7.0 standard drinks of alcohol    Types: 7 Shots of liquor per week    Comment: drinks rum   Drug use: No   Sexual activity: Not on file  Other Topics Concern   Not on file  Social History Narrative   Not on file   Social Drivers of Corporate investment banker  Strain: Low Risk  (07/30/2023)   Overall Financial Resource Strain (CARDIA)    Difficulty of Paying Living Expenses: Not hard at all  Food Insecurity: No Food Insecurity (07/30/2023)   Hunger Vital Sign    Worried About Running Out of Food in the Last Year: Never true    Ran Out of Food in the Last Year: Never true  Transportation Needs: No Transportation Needs (07/30/2023)   PRAPARE - Administrator, Civil Service (Medical): No    Lack of Transportation (Non-Medical): No  Physical Activity: Sufficiently Active (07/30/2023)   Exercise Vital Sign    Days of Exercise per Week: 5 days    Minutes of Exercise per Session: 30 min  Stress: No  Stress Concern Present (07/30/2023)   [employer] of Occupational Health - Occupational Stress Questionnaire    Feeling of Stress : Not at all  Social Connections: Moderately Integrated (07/30/2023)   Social Connection and Isolation Panel [NHANES]    Frequency of Communication with Friends and Family: More than three times a week    Frequency of Social Gatherings with Friends and Family: Three times a week    Attends Religious Services: More than 4 times per year    Active Member of Clubs or Organizations: No    Attends Banker Meetings: Never    Marital Status: Married    Tobacco Counseling Counseling given: Not Answered   Clinical Intake:  Pre-visit preparation completed: Yes  Pain : No/denies pain     Diabetes: No  How often do you need to have someone help you when you read instructions, pamphlets, or other written materials from your doctor or pharmacy?: 1 - Never  Interpreter Needed?: No  Information entered by :: Kandis Fantasia LPN   Activities of Daily Living    07/30/2023    2:40 PM  In your present state of health, do you have any difficulty performing the following activities:  Hearing? 0  Vision? 0  Difficulty concentrating or making decisions? 0  Walking or climbing stairs? 0  Dressing or bathing? 0  Doing errands, shopping? 0  Preparing Food and eating ? N  Using the Toilet? N  In the past six months, have you accidently leaked urine? N  Do you have problems with loss of bowel control? N  Managing your Medications? N  Managing your Finances? N  Housekeeping or managing your Housekeeping? N    Patient Care Team: Milian, Aleen Campi, FNP as PCP - General (Family Medicine)  Indicate any recent Medical Services you may have received from other than [facility] providers in the past year (date may be approximate).     Assessment:   This is a routine wellness examination for Micheal Mosley.  Hearing/Vision screen Hearing Screening -  Comments:: Denies hearing difficulties   Vision Screening - Comments:: Wears rx glasses - up to date with routine eye exams with Micheal Mosley     Goals Addressed             This Visit's Progress    Remain active and independent        Depression Screen    07/30/2023    2:46 PM 06/25/2023    2:27 PM  PHQ 2/9 Scores  PHQ - 2 Score 0 0  PHQ- 9 Score 0 0    Fall Risk    07/30/2023    2:48 PM 06/25/2023    2:28 PM  Fall Risk   Falls in the past year?  0 0  Number falls in past yr: 0 0  Injury with Fall? 0 0  Risk for fall due to : No Fall Risks No Fall Risks  Follow up Falls prevention discussed;Education provided;Falls evaluation completed Falls evaluation completed    MEDICARE RISK AT HOME: Medicare Risk at Home Any stairs in or around the home?: No If so, are there any without handrails?: No Home free of loose throw rugs in walkways, pet beds, electrical cords, etc?: Yes Adequate lighting in your home to reduce risk of falls?: Yes Life alert?: No Use of a cane, walker or w/c?: No Grab bars in the bathroom?: Yes Shower chair or bench in shower?: No Elevated toilet seat or a handicapped toilet?: Yes  TIMED UP AND GO:  Was the test performed? No    Cognitive Function:        07/30/2023    2:48 PM  6CIT Screen  What Year? 0 points  What month? 0 points  What time? 0 points  Count back from 20 0 points  Months in reverse 0 points  Repeat phrase 0 points  Total Score 0 points    Immunizations Immunization History  Administered Date(s) Administered   Fluad Trivalent(High Dose 65+) 06/25/2023   Moderna Sars-Covid-2 Vaccination 09/02/2019, 10/03/2019    TDAP status: Due, Education has been provided regarding the importance of this vaccine. Advised may receive this vaccine at local pharmacy or Health Dept. Aware to provide a copy of the vaccination record if obtained from local pharmacy or Health Dept. Verbalized acceptance and understanding.  Flu  Vaccine status: Up to date  Pneumococcal vaccine status: Due, Education has been provided regarding the importance of this vaccine. Advised may receive this vaccine at local pharmacy or Health Dept. Aware to provide a copy of the vaccination record if obtained from local pharmacy or Health Dept. Verbalized acceptance and understanding.  Covid-19 vaccine status: Information provided on how to obtain vaccines.   Qualifies for Shingles Vaccine? Yes   Zostavax completed No   Shingrix Completed?: No.    Education has been provided regarding the importance of this vaccine. Patient has been advised to call insurance company to determine out of pocket expense if they have not yet received this vaccine. Advised may also receive vaccine at local pharmacy or Health Dept. Verbalized acceptance and understanding.  Screening Tests Health Maintenance  Topic Date Due   Zoster Vaccines- Shingrix (1 of 2) Never done   Pneumonia Vaccine 10+ Years old (1 of 1 - PCV) Never done   COVID-19 Vaccine (3 - 2024-25 season) 04/12/2023   DTaP/Tdap/Td (1 - Tdap) 06/24/2024 (Originally 02/16/1970)   Colonoscopy  06/24/2024 (Originally 02/17/1996)   Hepatitis C Screening  06/24/2024 (Originally 02/16/1969)   Medicare Annual Wellness (AWV)  07/29/2024   INFLUENZA VACCINE  Completed   HPV VACCINES  Aged Out    Health Maintenance  Health Maintenance Due  Topic Date Due   Zoster Vaccines- Shingrix (1 of 2) Never done   Pneumonia Vaccine 57+ Years old (1 of 1 - PCV) Never done   COVID-19 Vaccine (3 - 2024-25 season) 04/12/2023    Colorectal cancer screening:  Patient declines at this time   Lung Cancer Screening: (Low Dose CT Chest recommended if Age 81-80 years, 20 pack-year currently smoking OR have quit w/in 15years.) does not qualify.   Lung Cancer Screening Referral: n/a  Additional Screening:  Hepatitis C Screening: does qualify  Vision Screening: Recommended annual ophthalmology exams for early detection of  glaucoma and other disorders of the eye. Is the patient up to date with their annual eye exam?  Yes  Who is the provider or what is the name of the office in which the patient attends annual eye exams? [facility] If pt is not established with a provider, would they like to be referred to a provider to establish care? No .   Dental Screening: Recommended annual dental exams for proper oral hygiene  Community Resource Referral / Chronic Care Management: CRR required this visit?  No   CCM required this visit?  No    Plan:     I have personally reviewed and noted the following in the patient's chart:   Medical and social history Use of alcohol, tobacco or illicit drugs  Current medications and supplements including opioid prescriptions. Patient is not currently taking opioid prescriptions. Functional ability and status Nutritional status Physical activity Advanced directives List of other physicians Hospitalizations, surgeries, and ER visits in previous 12 months Vitals Screenings to include cognitive, depression, and falls Referrals and appointments  In addition, I have reviewed and discussed with patient certain preventive protocols, quality metrics, and best practice recommendations. A written personalized care plan for preventive services as well as general preventive health recommendations were provided to patient.     Kandis Fantasia [place], [place]   [id number]   After Visit Summary: (Mail) Due to this being a telephonic visit, the after visit summary with patients personalized plan was offered to patient via mail   Nurse Notes: No concerns at this time

## 2023-07-30 NOTE — Patient Instructions (Signed)
Mr. Blais , Thank you for taking time to come for your Medicare Wellness Visit. I appreciate your ongoing commitment to your health goals. Please review the following plan we discussed and let me know if I can assist you in the future.   Referrals/Orders/Follow-Ups/Clinician Recommendations: Aim for 30 minutes of exercise or brisk walking, 6-8 glasses of water, and 5 servings of fruits and vegetables each day.  This is a list of the screening recommended for you and due dates:  Health Maintenance  Topic Date Due   Zoster (Shingles) Vaccine (1 of 2) Never done   Pneumonia Vaccine (1 of 1 - PCV) Never done   COVID-19 Vaccine (3 - 2024-25 season) 04/12/2023   DTaP/Tdap/Td vaccine (1 - Tdap) 06/24/2024*   Colon Cancer Screening  06/24/2024*   Hepatitis C Screening  06/24/2024*   Medicare Annual Wellness Visit  07/29/2024   Flu Shot  Completed   HPV Vaccine  Aged Out  *Topic was postponed. The date shown is not the original due date.    Advanced directives: (ACP Link)Information on Advanced Care Planning can be found at [facility] Advance Health Care Directives Advance Health Care Directives ([email or web address]   Next Medicare Annual Wellness Visit scheduled for next year: Yes

## 2023-09-25 ENCOUNTER — Ambulatory Visit: Payer: Medicare Other | Admitting: Family Medicine

## 2023-09-29 ENCOUNTER — Ambulatory Visit (INDEPENDENT_AMBULATORY_CARE_PROVIDER_SITE_OTHER): Payer: Medicare Other | Admitting: Family Medicine

## 2023-09-29 ENCOUNTER — Encounter: Payer: Self-pay | Admitting: Family Medicine

## 2023-09-29 VITALS — BP 163/74 | HR 64 | Temp 98.5°F | Ht 69.0 in | Wt 161.0 lb

## 2023-09-29 DIAGNOSIS — M1990 Unspecified osteoarthritis, unspecified site: Secondary | ICD-10-CM

## 2023-09-29 DIAGNOSIS — I10 Essential (primary) hypertension: Secondary | ICD-10-CM | POA: Diagnosis not present

## 2023-09-29 MED ORDER — PREDNISONE 20 MG PO TABS: 40.0000 mg | ORAL_TABLET | Freq: Every day | ORAL | 0 refills | Status: AC

## 2023-09-29 NOTE — Progress Notes (Addendum)
 Subjective:  Patient ID: Micheal Mosley, male    DOB: 1951/01/29, 73 y.o.   MRN: [id number]  Patient Care Team: Arrie Senate, FNP as PCP - General (Family Medicine)   Chief Complaint:  Medical Management of Chronic Issues (Blood pressure f/u)  HPI: Micheal Mosley is a 73 y.o. male presenting on 09/29/2023 for Medical Management of Chronic Issues (Blood pressure f/u)  Hypertension Has BP monitor at home Yes BP at home average - states that he is getting the same numbers at home.  ROS Denies anxiety, fatigue, peripheral edema, changes to vision, chest pain, headaches, palpitations, sweats, SOB, PND, orthopnea Meds none currently   CAD risks hypertension, hypercholesterolemia/hyperlipidemia  Shoulder pain  States that he takes hot showers in the morning. Mainly hurts in the morning. Taking meloxicam. Does not know if it is helping.  Taking tylenol nightly.  He is established with ortho. Notes are not available in chart.   Relevant past medical, surgical, family, and social history reviewed and updated as indicated.  Allergies and medications reviewed and updated. Data reviewed: Chart in Epic.  Past Medical History:  Diagnosis Date   Arthritis    Chronic kidney disease    h/o kidney stone    Past Surgical History:  Procedure Laterality Date   LUMBAR LAMINECTOMY/DECOMPRESSION MICRODISCECTOMY N/A 10/29/2015   Procedure: L4-5 DECOMPRESSION;  Surgeon: Kerrin Champagne, MD;  Location: [facility] OR;  Service: Orthopedics;  Laterality: N/A;   TRIGGER FINGER RELEASE     on the right hand    Social History   Socioeconomic History   Marital status: Married    Spouse name: Not on file   Number of children: Not on file   Years of education: Not on file   Highest education level: Not on file  Occupational History   Not on file  Tobacco Use   Smoking status: Former    Current packs/day: 0.50    Average packs/day: 0.5 packs/day for 8.0 years (4.0 ttl pk-yrs)    Types: Cigarettes    Smokeless tobacco: Former    Types: Chew    Quit date: 10/24/1983  Substance and Sexual Activity   Alcohol use: Yes    Alcohol/week: 7.0 standard drinks of alcohol    Types: 7 Shots of liquor per week    Comment: drinks rum   Drug use: No   Sexual activity: Not on file  Other Topics Concern   Not on file  Social History Narrative   Not on file   Social Drivers of Health   Financial Resource Strain: Low Risk  (07/30/2023)   Overall Financial Resource Strain (CARDIA)    Difficulty of Paying Living Expenses: Not hard at all  Food Insecurity: No Food Insecurity (07/30/2023)   Hunger Vital Sign    Worried About Running Out of Food in the Last Year: Never true    Ran Out of Food in the Last Year: Never true  Transportation Needs: No Transportation Needs (07/30/2023)   PRAPARE - Administrator, Civil Service (Medical): No    Lack of Transportation (Non-Medical): No  Physical Activity: Sufficiently Active (07/30/2023)   Exercise Vital Sign    Days of Exercise per Week: 5 days    Minutes of Exercise per Session: 30 min  Stress: No Stress Concern Present (07/30/2023)   [employer] of Occupational Health - Occupational Stress Questionnaire    Feeling of Stress : Not at all  Social Connections: Moderately Integrated (07/30/2023)   Social  Connection and Isolation Panel [NHANES]    Frequency of Communication with Friends and Family: More than three times a week    Frequency of Social Gatherings with Friends and Family: Three times a week    Attends Religious Services: More than 4 times per year    Active Member of Clubs or Organizations: No    Attends Banker Meetings: Never    Marital Status: Married  Catering manager Violence: Not At Risk (07/30/2023)   Humiliation, Afraid, Rape, and Kick questionnaire    Fear of Current or Ex-Partner: No    Emotionally Abused: No    Physically Abused: No    Sexually Abused: No    Outpatient Encounter Medications  as of 09/29/2023  Medication Sig   acetaminophen (TYLENOL) 650 MG CR tablet Take 1,300 mg by mouth every 8 (eight) hours as needed for pain.   meloxicam (MOBIC) 15 MG tablet    pravastatin (PRAVACHOL) 80 MG tablet Take 80 mg by mouth every evening.   No facility-administered encounter medications on file as of 09/29/2023.   No Known Allergies  Review of Systems As per HPI  Objective:  BP (!) 163/74   Pulse 64   Temp 98.5 F (36.9 C)   Ht 5\' 9"  (1.753 m)   Wt 161 lb (73 kg)   SpO2 98%   BMI 23.78 kg/m    Wt Readings from Last 3 Encounters:  09/29/23 161 lb (73 kg)  07/30/23 166 lb (75.3 kg)  06/25/23 166 lb (75.3 kg)   Physical Exam Constitutional:      General: He is awake. He is not in acute distress.    Appearance: Normal appearance. He is well-developed and well-groomed. He is not ill-appearing, toxic-appearing or diaphoretic.  Cardiovascular:     Rate and Rhythm: Normal rate and regular rhythm.     Pulses: Normal pulses.          Radial pulses are 2+ on the right side and 2+ on the left side.       Posterior tibial pulses are 2+ on the right side and 2+ on the left side.     Heart sounds: Normal heart sounds. No murmur heard.    No gallop.  Pulmonary:     Effort: Pulmonary effort is normal. No respiratory distress.     Breath sounds: Normal breath sounds. No stridor. No wheezing, rhonchi or rales.  Musculoskeletal:     Cervical back: Full passive range of motion without pain and neck supple.     Right lower leg: No edema.     Left lower leg: No edema.  Skin:    General: Skin is warm.     Capillary Refill: Capillary refill takes less than 2 seconds.  Neurological:     General: No focal deficit present.     Mental Status: He is alert, oriented to person, place, and time and easily aroused. Mental status is at baseline.     GCS: GCS eye subscore is 4. GCS verbal subscore is 5. GCS motor subscore is 6.     Motor: No weakness.  Psychiatric:        Attention and  Perception: Attention and perception normal.        Mood and Affect: Mood and affect normal.        Speech: Speech normal.        Behavior: Behavior normal. Behavior is cooperative.        Thought Content: Thought content normal. Thought content does  not include homicidal or suicidal ideation. Thought content does not include homicidal or suicidal plan.        Cognition and Memory: Cognition and memory normal.        Judgment: Judgment normal.     Results for orders placed or performed in visit on 06/25/23  Bayer DCA Hb A1c Waived   Collection Time: 06/25/23  2:57 PM  Result Value Ref Range   HB A1C (BAYER DCA - WAIVED) 5.4 4.8 - 5.6 %  CBC with Differential/Platelet   Collection Time: 06/25/23  2:59 PM  Result Value Ref Range   WBC 6.3 3.4 - 10.8 x10E3/uL   RBC 4.36 4.14 - 5.80 x10E6/uL   Hemoglobin 13.6 13.0 - 17.7 g/dL   Hematocrit 40.9 81.1 - 51.0 %   MCV 95 79 - 97 fL   MCH 31.2 26.6 - 33.0 pg   MCHC 32.9 31.5 - 35.7 g/dL   RDW 91.4 78.2 - 95.6 %   Platelets 270 150 - 450 x10E3/uL   Neutrophils 53 Not Estab. %   Lymphs 37 Not Estab. %   Monocytes 10 Not Estab. %   Eos 0 Not Estab. %   Basos 0 Not Estab. %   Neutrophils Absolute 3.3 1.4 - 7.0 x10E3/uL   Lymphocytes Absolute 2.3 0.7 - 3.1 x10E3/uL   Monocytes Absolute 0.6 0.1 - 0.9 x10E3/uL   EOS (ABSOLUTE) 0.0 0.0 - 0.4 x10E3/uL   Basophils Absolute 0.0 0.0 - 0.2 x10E3/uL   Immature Granulocytes 0 Not Estab. %   Immature Grans (Abs) 0.0 0.0 - 0.1 x10E3/uL  CMP14+EGFR   Collection Time: 06/25/23  2:59 PM  Result Value Ref Range   Glucose 80 70 - 99 mg/dL   BUN 19 8 - 27 mg/dL   Creatinine, Ser 2.13 0.76 - 1.27 mg/dL   eGFR 88 >08 MV/HQI/6.96   BUN/Creatinine Ratio 21 10 - 24   Sodium 141 134 - 144 mmol/L   Potassium 4.3 3.5 - 5.2 mmol/L   Chloride 103 96 - 106 mmol/L   CO2 24 20 - 29 mmol/L   Calcium 10.0 8.6 - 10.2 mg/dL   Total Protein 6.7 6.0 - 8.5 g/dL   Albumin 4.6 3.8 - 4.8 g/dL   Globulin, Total 2.1 1.5  - 4.5 g/dL   Bilirubin Total 0.3 0.0 - 1.2 mg/dL   Alkaline Phosphatase 85 44 - 121 IU/L   AST 21 0 - 40 IU/L   ALT 19 0 - 44 IU/L  Lipid panel   Collection Time: 06/25/23  2:59 PM  Result Value Ref Range   Cholesterol, Total 213 (H) 100 - 199 mg/dL   Triglycerides 295 (H) 0 - 149 mg/dL   HDL 56 >28 mg/dL   VLDL Cholesterol Cal 28 5 - 40 mg/dL   LDL Chol Calc (NIH) 413 (H) 0 - 99 mg/dL   Chol/HDL Ratio 3.8 0.0 - 5.0 ratio  TSH   Collection Time: 06/25/23  2:59 PM  Result Value Ref Range   TSH 2.000 0.450 - 4.500 uIU/mL  PSA   Collection Time: 06/25/23  2:59 PM  Result Value Ref Range   Prostate Specific Ag, Serum 1.3 0.0 - 4.0 ng/mL       09/29/2023   11:11 AM 07/30/2023    2:46 PM 06/25/2023    2:27 PM  Depression screen PHQ 2/9  Decreased Interest 0 0 0  Down, Depressed, Hopeless 0 0 0  PHQ - 2 Score 0 0 0  Altered sleeping  0 0 0  Tired, decreased energy 0 0 0  Change in appetite 0 0 0  Feeling bad or failure about yourself  0 0 0  Trouble concentrating 0 0 0  Moving slowly or fidgety/restless 0 0 0  Suicidal thoughts 0 0 0  PHQ-9 Score 0 0 0  Difficult doing work/chores Not difficult at all Not difficult at all Not difficult at all       09/29/2023   11:12 AM 06/25/2023    2:28 PM  GAD 7 : Generalized Anxiety Score  Nervous, Anxious, on Edge 0 0  Control/stop worrying 0 0  Worry too much - different things 0 0  Trouble relaxing 0 0  Restless 0 0  Easily annoyed or irritable 0 0  Afraid - awful might happen 0 0  Total GAD 7 Score 0 0  Anxiety Difficulty Not difficult at all Not difficult at all   Pertinent labs & imaging results that were available during my care of the patient were reviewed by me and considered in my medical decision making.  Assessment & Plan:  Rylan was seen today for medical management of chronic issues.  Diagnoses and all orders for this visit:  Primary hypertension Not at goal. Patient refused starting medication at this time.  Discussed risks with patient and reviewed ASCVD risk score. Recommend that he try DASH diet and stopping meloxicam as NSAIDs can increase BP as well. Encouraged patient to research amlodipine to determine if he is willing to start medication.   Arthritis Continues to have pain in his shoulder. He is established with ortho. Recommend that patient follow up with specialty. Reviewed imaging from 06/25/2020. Encouraged patient to continue heat, ice, stretching, TENS unit, compression. Declined PT.  -     predniSONE (DELTASONE) 20 MG tablet; Take 2 tablets (40 mg total) by mouth daily with breakfast for 5 days.  XR Shoulder Right (Accession [id number]) (Order [id number])  AP and lateral and outlet views right shoulder with mimiinal anterior spur  right glenoid. Minimal joint line narrow. SAS is at 9.0 mm with mild  hypertrophy of the distal clavicle.   Continue all other maintenance medications.  Follow up plan: Return in about 3 months (around 12/27/2023) for Chronic Condition Follow up.   Continue healthy lifestyle choices, including diet (rich in fruits, vegetables, and lean proteins, and low in salt and simple carbohydrates) and exercise (at least 30 minutes of moderate physical activity daily).  Written and verbal instructions provided   The above assessment and management plan was discussed with the patient. The patient verbalized understanding of and has agreed to the management plan. Patient is aware to call the clinic if they develop any new symptoms or if symptoms persist or worsen. Patient is aware when to return to the clinic for a follow-up visit. Patient educated on when it is appropriate to go to the emergency department.   Neale Burly, DNP-FNP [facility] [address] [place], [place] [address] [phone]

## 2023-09-29 NOTE — Patient Instructions (Addendum)
 Monitoring your BP at home   Your BP was elevated today in office  Please keep a log of your BP at home.  The best time to take BP is 1st thing in the morning after waking.  Sit for 5 minutes with feet flat on the floor, arm at heart level.  Options for BP cuffs are at [employer], [employer], [employer], [facility] & [facility].  We will review measurements at follow up and determine plan for BP management.  If you have access, you can send a message in MyChart with your measurements prior to your follow up appointment.  The brand I recommend to get is Omron (Bronze).   Amlodipine is a medication I would like to start

## 2023-10-19 ENCOUNTER — Other Ambulatory Visit: Payer: Self-pay | Admitting: Family Medicine

## 2023-10-19 NOTE — Telephone Encounter (Signed)
 Copied from CRM [id number]. Topic: Clinical - Medication Refill >> Oct 19, 2023  9:26 AM Elle L wrote: Most Recent Primary Care Visit:   Medication: pravastatin (PRAVACHOL) 80 MG tablet  Has the patient contacted their pharmacy? Yes  Is this the correct pharmacy for this prescription? Yes  This is the patient's preferred pharmacy:  [facility] - [place], [place] - [address] [name] [address] Phone: [phone] Fax: [phone]  Has the prescription been filled recently? No  Is the patient out of the medication? No. The patient has a few days left but is out of refills from his previous provider.   Has the patient been seen for an appointment in the last year OR does the patient have an upcoming appointment? Yes  Can we respond through MyChart? No  Agent: Please be advised that Rx refills may take up to 3 business days. We ask that you follow-up with your pharmacy.

## 2023-10-20 ENCOUNTER — Other Ambulatory Visit: Payer: Self-pay | Admitting: Family Medicine

## 2023-11-03 ENCOUNTER — Other Ambulatory Visit: Payer: Self-pay | Admitting: Family Medicine

## 2023-11-30 ENCOUNTER — Other Ambulatory Visit: Payer: Self-pay | Admitting: Family Medicine

## 2023-12-08 ENCOUNTER — Telehealth: Payer: Self-pay | Admitting: Physical Medicine and Rehabilitation

## 2023-12-08 NOTE — Telephone Encounter (Signed)
 Patient called and needs an appointment for his lower back. WG#[phone] He coming over from Dr. Alanson Alliance.

## 2023-12-21 ENCOUNTER — Ambulatory Visit: Admitting: Physical Medicine and Rehabilitation

## 2023-12-29 ENCOUNTER — Ambulatory Visit (INDEPENDENT_AMBULATORY_CARE_PROVIDER_SITE_OTHER): Payer: Medicare Other | Admitting: Family Medicine

## 2023-12-29 ENCOUNTER — Encounter: Payer: Self-pay | Admitting: Family Medicine

## 2023-12-29 VITALS — BP 142/76 | HR 68 | Temp 97.9°F | Ht 69.0 in | Wt 153.2 lb

## 2023-12-29 DIAGNOSIS — I10 Essential (primary) hypertension: Secondary | ICD-10-CM | POA: Diagnosis not present

## 2023-12-29 DIAGNOSIS — R251 Tremor, unspecified: Secondary | ICD-10-CM

## 2023-12-29 DIAGNOSIS — E7800 Pure hypercholesterolemia, unspecified: Secondary | ICD-10-CM | POA: Diagnosis not present

## 2023-12-29 DIAGNOSIS — M47812 Spondylosis without myelopathy or radiculopathy, cervical region: Secondary | ICD-10-CM

## 2023-12-29 DIAGNOSIS — M25511 Pain in right shoulder: Secondary | ICD-10-CM

## 2023-12-29 DIAGNOSIS — M48062 Spinal stenosis, lumbar region with neurogenic claudication: Secondary | ICD-10-CM

## 2023-12-29 DIAGNOSIS — G8929 Other chronic pain: Secondary | ICD-10-CM

## 2023-12-29 DIAGNOSIS — Z87891 Personal history of nicotine dependence: Secondary | ICD-10-CM | POA: Diagnosis not present

## 2023-12-29 MED ORDER — DICLOFENAC SODIUM 1 % EX GEL: 4.0000 g | Freq: Four times a day (QID) | CUTANEOUS | 1 refills | Status: DC

## 2023-12-29 NOTE — Progress Notes (Signed)
 Subjective:  Patient ID: Micheal Mosley, male    DOB: Aug 07, 1951, 73 y.o.   MRN: [id number]  Patient Care Team: Chrystine Crate, FNP as PCP - General (Family Medicine)   Chief Complaint:  Medical Management of Chronic Issues (Having some arthritis pain in right shoulder for the past 6 months but is starting to hurt more in the past month.)  HPI: Micheal Mosley is a 73 y.o. male presenting on 12/29/2023 for Medical Management of Chronic Issues (Having some arthritis pain in right shoulder for the past 6 months but is starting to hurt more in the past month.)  HPI 1. Primary hypertension Has BP monitor at home Yes BP at home average 140-150s ROS Denies anxiety, fatigue, peripheral edema, changes to vision, chest pain, headaches, palpitations, sweats, SOB, PND, orthopnea Meds declines  CAD risks hypertension, hypercholesterolemia/hyperlipidemia  2. Pure hypercholesterolemia Continues to take pravastatin . Denies side effects. Denies changes to diet and lifestyle.   4. Tremor States that this is stable and not interfering with his daily life. Only in his hands.   5. Spinal stenosis, lumbar region, with neurogenic claudication Established with orthopedics, planning for surgery.   6. Right shoulder pain  Established with orthopedics.   Relevant past medical, surgical, family, and social history reviewed and updated as indicated.  Allergies and medications reviewed and updated. Data reviewed: Chart in Epic.   Past Medical History:  Diagnosis Date   Arthritis    Chronic kidney disease    h/o kidney stone    Past Surgical History:  Procedure Laterality Date   LUMBAR LAMINECTOMY/DECOMPRESSION MICRODISCECTOMY N/A 10/29/2015   Procedure: L4-5 DECOMPRESSION;  Surgeon: Alphonso Jean, MD;  Location: [facility] OR;  Service: Orthopedics;  Laterality: N/A;   TRIGGER FINGER RELEASE     on the right hand    Social History   Socioeconomic History   Marital status: Married    Spouse  name: Not on file   Number of children: Not on file   Years of education: Not on file   Highest education level: Not on file  Occupational History   Not on file  Tobacco Use   Smoking status: Former    Current packs/day: 0.50    Average packs/day: 0.5 packs/day for 8.0 years (4.0 ttl pk-yrs)    Types: Cigarettes   Smokeless tobacco: Former    Types: Chew    Quit date: 10/24/1983  Substance and Sexual Activity   Alcohol use: Yes    Alcohol/week: 7.0 standard drinks of alcohol    Types: 7 Shots of liquor per week    Comment: drinks rum   Drug use: No   Sexual activity: Not on file  Other Topics Concern   Not on file  Social History Narrative   Not on file   Social Drivers of Health   Financial Resource Strain: Low Risk  (07/30/2023)   Overall Financial Resource Strain (CARDIA)    Difficulty of Paying Living Expenses: Not hard at all  Food Insecurity: No Food Insecurity (07/30/2023)   Hunger Vital Sign    Worried About Running Out of Food in the Last Year: Never true    Ran Out of Food in the Last Year: Never true  Transportation Needs: No Transportation Needs (07/30/2023)   PRAPARE - Administrator, Civil Service (Medical): No    Lack of Transportation (Non-Medical): No  Physical Activity: Sufficiently Active (07/30/2023)   Exercise Vital Sign    Days of Exercise per Week:  5 days    Minutes of Exercise per Session: 30 min  Stress: No Stress Concern Present (07/30/2023)   [employer] of Occupational Health - Occupational Stress Questionnaire    Feeling of Stress : Not at all  Social Connections: Moderately Integrated (07/30/2023)   Social Connection and Isolation Panel [NHANES]    Frequency of Communication with Friends and Family: More than three times a week    Frequency of Social Gatherings with Friends and Family: Three times a week    Attends Religious Services: More than 4 times per year    Active Member of Clubs or Organizations: No    Attends  Banker Meetings: Never    Marital Status: Married  Catering manager Violence: Not At Risk (07/30/2023)   Humiliation, Afraid, Rape, and Kick questionnaire    Fear of Current or Ex-Partner: No    Emotionally Abused: No    Physically Abused: No    Sexually Abused: No   Outpatient Encounter Medications as of 12/29/2023  Medication Sig   acetaminophen  (TYLENOL ) 650 MG CR tablet Take 1,300 mg by mouth every 8 (eight) hours as needed for pain.   meloxicam (MOBIC) 15 MG tablet take 1 tablet by mouth once a day as needed for pain and swelling. do not take at the same time as ibuprofen or celebrex    pravastatin  (PRAVACHOL ) 80 MG tablet take 1 tablet once daily for high cholesterol.   No facility-administered encounter medications on file as of 12/29/2023.   No Known Allergies  Review of Systems As per HPI  Objective:  BP (!) 142/76   Pulse 68   Temp 97.9 F (36.6 C)   Ht 5\' 9"  (1.753 m)   Wt 153 lb 3.2 oz (69.5 kg)   SpO2 96%   BMI 22.62 kg/m    Wt Readings from Last 3 Encounters:  09/29/23 161 lb (73 kg)  07/30/23 166 lb (75.3 kg)  06/25/23 166 lb (75.3 kg)   Physical Exam Constitutional:      General: He is awake. He is not in acute distress.    Appearance: Normal appearance. He is well-developed and well-groomed. He is not ill-appearing, toxic-appearing or diaphoretic.  Cardiovascular:     Rate and Rhythm: Normal rate and regular rhythm.     Pulses: Normal pulses.          Radial pulses are 2+ on the right side and 2+ on the left side.       Posterior tibial pulses are 2+ on the right side and 2+ on the left side.     Heart sounds: Normal heart sounds. No murmur heard.    No gallop.  Pulmonary:     Effort: Pulmonary effort is normal. No respiratory distress.     Breath sounds: Normal breath sounds. No stridor. No wheezing, rhonchi or rales.  Musculoskeletal:     Cervical back: Full passive range of motion without pain and neck supple.     Right lower leg: No  edema.     Left lower leg: No edema.  Skin:    General: Skin is warm.     Capillary Refill: Capillary refill takes less than 2 seconds.  Neurological:     General: No focal deficit present.     Mental Status: He is alert, oriented to person, place, and time and easily aroused. Mental status is at baseline.     GCS: GCS eye subscore is 4. GCS verbal subscore is 5. GCS motor subscore is 6.  Motor: No weakness.  Psychiatric:        Attention and Perception: Attention and perception normal.        Mood and Affect: Mood and affect normal.        Speech: Speech normal.        Behavior: Behavior normal. Behavior is cooperative.        Thought Content: Thought content normal. Thought content does not include homicidal or suicidal ideation. Thought content does not include homicidal or suicidal plan.        Cognition and Memory: Cognition and memory normal.        Judgment: Judgment normal.     Results for orders placed or performed in visit on 06/25/23  Bayer DCA Hb A1c Waived   Collection Time: 06/25/23  2:57 PM  Result Value Ref Range   HB A1C (BAYER DCA - WAIVED) 5.4 4.8 - 5.6 %  CBC with Differential/Platelet   Collection Time: 06/25/23  2:59 PM  Result Value Ref Range   WBC 6.3 3.4 - 10.8 x10E3/uL   RBC 4.36 4.14 - 5.80 x10E6/uL   Hemoglobin 13.6 13.0 - 17.7 g/dL   Hematocrit 56.2 13.0 - 51.0 %   MCV 95 79 - 97 fL   MCH 31.2 26.6 - 33.0 pg   MCHC 32.9 31.5 - 35.7 g/dL   RDW 86.5 78.4 - 69.6 %   Platelets 270 150 - 450 x10E3/uL   Neutrophils 53 Not Estab. %   Lymphs 37 Not Estab. %   Monocytes 10 Not Estab. %   Eos 0 Not Estab. %   Basos 0 Not Estab. %   Neutrophils Absolute 3.3 1.4 - 7.0 x10E3/uL   Lymphocytes Absolute 2.3 0.7 - 3.1 x10E3/uL   Monocytes Absolute 0.6 0.1 - 0.9 x10E3/uL   EOS (ABSOLUTE) 0.0 0.0 - 0.4 x10E3/uL   Basophils Absolute 0.0 0.0 - 0.2 x10E3/uL   Immature Granulocytes 0 Not Estab. %   Immature Grans (Abs) 0.0 0.0 - 0.1 x10E3/uL  CMP14+EGFR    Collection Time: 06/25/23  2:59 PM  Result Value Ref Range   Glucose 80 70 - 99 mg/dL   BUN 19 8 - 27 mg/dL   Creatinine, Ser 2.95 0.76 - 1.27 mg/dL   eGFR 88 >28 UX/LKG/4.01   BUN/Creatinine Ratio 21 10 - 24   Sodium 141 134 - 144 mmol/L   Potassium 4.3 3.5 - 5.2 mmol/L   Chloride 103 96 - 106 mmol/L   CO2 24 20 - 29 mmol/L   Calcium 10.0 8.6 - 10.2 mg/dL   Total Protein 6.7 6.0 - 8.5 g/dL   Albumin 4.6 3.8 - 4.8 g/dL   Globulin, Total 2.1 1.5 - 4.5 g/dL   Bilirubin Total 0.3 0.0 - 1.2 mg/dL   Alkaline Phosphatase 85 44 - 121 IU/L   AST 21 0 - 40 IU/L   ALT 19 0 - 44 IU/L  Lipid panel   Collection Time: 06/25/23  2:59 PM  Result Value Ref Range   Cholesterol, Total 213 (H) 100 - 199 mg/dL   Triglycerides 027 (H) 0 - 149 mg/dL   HDL 56 >25 mg/dL   VLDL Cholesterol Cal 28 5 - 40 mg/dL   LDL Chol Calc (NIH) 366 (H) 0 - 99 mg/dL   Chol/HDL Ratio 3.8 0.0 - 5.0 ratio  TSH   Collection Time: 06/25/23  2:59 PM  Result Value Ref Range   TSH 2.000 0.450 - 4.500 uIU/mL  PSA   Collection Time: 06/25/23  2:59 PM  Result Value Ref Range   Prostate Specific Ag, Serum 1.3 0.0 - 4.0 ng/mL       12/29/2023    4:15 PM 09/29/2023   11:11 AM 07/30/2023    2:46 PM 06/25/2023    2:27 PM  Depression screen PHQ 2/9  Decreased Interest 0 0 0 0  Down, Depressed, Hopeless 0 0 0 0  PHQ - 2 Score 0 0 0 0  Altered sleeping 0 0 0 0  Tired, decreased energy 0 0 0 0  Change in appetite 0 0 0 0  Feeling bad or failure about yourself  0 0 0 0  Trouble concentrating 0 0 0 0  Moving slowly or fidgety/restless 0 0 0 0  Suicidal thoughts 0 0 0 0  PHQ-9 Score 0 0 0 0  Difficult doing work/chores Not difficult at all Not difficult at all Not difficult at all Not difficult at all       12/29/2023    4:15 PM 09/29/2023   11:12 AM 06/25/2023    2:28 PM  GAD 7 : Generalized Anxiety Score  Nervous, Anxious, on Edge 0 0 0  Control/stop worrying 0 0 0  Worry too much - different things 0 0 0  Trouble  relaxing 0 0 0  Restless 0 0 0  Easily annoyed or irritable 0 0 0  Afraid - awful might happen 0 0 0  Total GAD 7 Score 0 0 0  Anxiety Difficulty Not difficult at all Not difficult at all Not difficult at all   Pertinent labs & imaging results that were available during my care of the patient were reviewed by me and considered in my medical decision making.  Assessment & Plan:  Bessie was seen today for medical management of chronic issues.  Diagnoses and all orders for this visit:  1. Primary hypertension (Primary) Not at goal. Adamantly refused to start medication for blood pressure control. Discussed that pt is at risk for cardiovascular events and renal disease due to elevated BP. Discussed with patient that NSAIDs can increase BP as well. Will switch patient from oral to topical to help reduce systemic effects.   2. Former tobacco use Praised patient on his continued remission. Quit over 20 years ago.   3. Pure hypercholesterolemia Stable on pravastatin . Continue with current regimen.   4. Tremor Stable. Will continue to monitor.   5. Spinal stenosis, lumbar region, with neurogenic claudication Discussed with patient that NSAIDs can increase BP as well. Will switch patient from oral to topical to help reduce systemic effects. Patient to follow up with ortho.  - diclofenac Sodium (VOLTAREN) 1 % GEL; Apply 4 g topically 4 (four) times daily.  Dispense: 20 g; Refill: 1  6. Cervical spondylosis Discussed with patient that NSAIDs can increase BP as well. Will switch patient from oral to topical to help reduce systemic effects. Patient to follow up with ortho.  - diclofenac Sodium (VOLTAREN) 1 % GEL; Apply 4 g topically 4 (four) times daily.  Dispense: 20 g; Refill: 1  7. Chronic right shoulder pain Discussed with patient that NSAIDs can increase BP as well. Will switch patient from oral to topical to help reduce systemic effects. Patient to follow up with ortho.  - diclofenac Sodium  (VOLTAREN) 1 % GEL; Apply 4 g topically 4 (four) times daily.  Dispense: 20 g; Refill: 1   Continue all other maintenance medications.  Follow up plan: Return in about 6 months (around 06/30/2024) for  Chronic Condition Follow up.  Continue healthy lifestyle choices, including diet (rich in fruits, vegetables, and lean proteins, and low in salt and simple carbohydrates) and exercise (at least 30 minutes of moderate physical activity daily).  Written and verbal instructions provided   The above assessment and management plan was discussed with the patient. The patient verbalized understanding of and has agreed to the management plan. Patient is aware to call the clinic if they develop any new symptoms or if symptoms persist or worsen. Patient is aware when to return to the clinic for a follow-up visit. Patient educated on when it is appropriate to go to the emergency department.   Jacqualyn Mates, DNP-FNP [facility] [address] [place], [place] [address] [phone]

## 2024-01-07 ENCOUNTER — Telehealth: Payer: Self-pay | Admitting: Family Medicine

## 2024-01-07 ENCOUNTER — Ambulatory Visit: Admitting: Orthopedic Surgery

## 2024-01-07 ENCOUNTER — Other Ambulatory Visit: Payer: Self-pay | Admitting: Family Medicine

## 2024-01-07 DIAGNOSIS — M48062 Spinal stenosis, lumbar region with neurogenic claudication: Secondary | ICD-10-CM

## 2024-01-07 DIAGNOSIS — G8929 Other chronic pain: Secondary | ICD-10-CM

## 2024-01-07 DIAGNOSIS — M1990 Unspecified osteoarthritis, unspecified site: Secondary | ICD-10-CM

## 2024-01-07 DIAGNOSIS — M47812 Spondylosis without myelopathy or radiculopathy, cervical region: Secondary | ICD-10-CM

## 2024-01-07 MED ORDER — MELOXICAM 15 MG PO TABS: 15.0000 mg | ORAL_TABLET | Freq: Every day | ORAL | 1 refills | Status: DC

## 2024-01-07 NOTE — Telephone Encounter (Unsigned)
 Copied from CRM [id number]. Topic: Clinical - Medical Advice >> Jan 07, 2024 10:22 AM Turkey B wrote: Reason for CRM: pt's wife called in says wants to go back to pill from cream for arthritis, because the cream isnt working as well as the pill.

## 2024-01-07 NOTE — Telephone Encounter (Signed)
 Electronic refill request came in for Meloxicam Please advise    Copied from CRM [id number]. Topic: Clinical - Medication Question >> Jan 07, 2024  9:47 AM Georgeann Kindred wrote: Reason for CRM: Wife, Amalia Badder, called and stated that the patient would like to go back to taking the pill for pain in shoulder. States that the he could tell the difference in not taking the pill for pain. In addition, she states that the cream is not working. Please contact [phone].

## 2024-01-07 NOTE — Addendum Note (Signed)
 Addended by: Jacqualyn Mates on: 01/07/2024 04:49 PM   Modules accepted: Orders

## 2024-01-07 NOTE — Telephone Encounter (Signed)
 Wife aware of below and will let husband know. Chrystine Crate, FNP to [facility] Clinical (Selected Message)   01/07/24  4:49 PM Please explain to patient that this increases CV risk, kidney damage and risk of GI bleed/ulcer.

## 2024-08-01 ENCOUNTER — Ambulatory Visit: Payer: Medicare Other

## 2024-08-01 VITALS — BP 142/76 | HR 68 | Ht 69.0 in | Wt 153.0 lb

## 2024-08-01 DIAGNOSIS — Z0000 Encounter for general adult medical examination without abnormal findings: Secondary | ICD-10-CM | POA: Diagnosis not present

## 2024-08-01 NOTE — Progress Notes (Signed)
 "  Chief Complaint  Patient presents with   Medicare Wellness     Subjective:   Everhett Bozard is a 73 y.o. male who presents for a Medicare Annual Wellness Visit.  Visit info / Clinical Intake: Medicare Wellness Visit Type:: Subsequent Annual Wellness Visit Persons participating in visit and providing information:: patient Medicare Wellness Visit Mode:: Telephone If telephone:: video declined Since this visit was completed virtually, some vitals may be partially provided or unavailable. Missing vitals are due to the limitations of the virtual format.: Unable to obtain vitals - no equipment If Telephone or Video please confirm:: I connected with patient using audio/video enable telemedicine. I verified patient identity with two identifiers, discussed telehealth limitations, and patient agreed to proceed. Patient Location:: home Provider Location:: home office Interpreter Needed?: No Pre-visit prep was completed: yes AWV questionnaire completed by patient prior to visit?: no Living arrangements:: lives with spouse/significant other Patient's Overall Health Status Rating: excellent Typical amount of pain: some Does pain affect daily life?: no Are you currently prescribed opioids?: no  Dietary Habits and Nutritional Risks How many meals a day?: 3 Eats fruit and vegetables daily?: yes Most meals are obtained by: preparing own meals In the last 2 weeks, have you had any of the following?: none Diabetic:: no  Functional Status Activities of Daily Living (to include ambulation/medication): Independent Ambulation: Independent Medication Administration: Independent Home Management (perform basic housework or laundry): Independent Manage your own finances?: yes Primary transportation is: driving Concerns about vision?: no *vision screening is required for [facility]* (Dr. Octavia, 2025) Concerns about hearing?: no  Fall Screening Falls in the past year?: 1 Number of falls in past year:  0 Was there an injury with Fall?: 1 Fall Risk Category Calculator: 2 Patient Fall Risk Level: Moderate Fall Risk  Fall Risk Patient at Risk for Falls Due to: No Fall Risks Fall risk Follow up: Falls evaluation completed; Education provided  Home and Transportation Safety: All rugs have non-skid backing?: yes All stairs or steps have railings?: yes Grab bars in the bathtub or shower?: yes Have non-skid surface in bathtub or shower?: yes Good home lighting?: yes Regular seat belt use?: yes Hospital stays in the last year:: no  Cognitive Assessment Difficulty concentrating, remembering, or making decisions? : no Will 6CIT or Mini Cog be Completed: yes What year is it?: 0 points What month is it?: 0 points Give patient an address phrase to remember (5 components): [address] [place],  [place] About what time is it?: 0 points Count backwards from 20 to 1: 0 points Say the months of the year in reverse: 0 points Repeat the address phrase from earlier: 0 points 6 CIT Score: 0 points  Advance Directives (For Healthcare) Does Patient Have a Medical Advance Directive?: No Would patient like information on creating a medical advance directive?: No - Patient declined  Reviewed/Updated  Reviewed/Updated: Reviewed All (Medical, Surgical, Family, Medications, Allergies, Care Teams, Patient Goals); Medical History; Surgical History; Family History; Medications; Allergies; Care Teams; Patient Goals    Allergies (verified) Patient has no known allergies.   Current Medications (verified) Outpatient Encounter Medications as of 08/01/2024  Medication Sig   acetaminophen  (TYLENOL ) 650 MG CR tablet Take 1,300 mg by mouth every 8 (eight) hours as needed for pain.   meloxicam  (MOBIC ) 15 MG tablet Take 1 tablet (15 mg total) by mouth daily.   pravastatin  (PRAVACHOL ) 80 MG tablet take 1 tablet once daily for high cholesterol.   No facility-administered encounter medications on file  as of  08/01/2024.    History: Past Medical History:  Diagnosis Date   Arthritis    Chronic kidney disease    h/o kidney stone   Past Surgical History:  Procedure Laterality Date   LUMBAR LAMINECTOMY/DECOMPRESSION MICRODISCECTOMY N/A 10/29/2015   Procedure: L4-5 DECOMPRESSION;  Surgeon: Lynwood FORBES Better, MD;  Location: [facility] OR;  Service: Orthopedics;  Laterality: N/A;   TRIGGER FINGER RELEASE     on the right hand   History reviewed. No pertinent family history. Social History   Occupational History   Not on file  Tobacco Use   Smoking status: Former    Current packs/day: 0.50    Average packs/day: 0.5 packs/day for 8.0 years (4.0 ttl pk-yrs)    Types: Cigarettes   Smokeless tobacco: Former    Types: Chew    Quit date: 10/24/1983  Substance and Sexual Activity   Alcohol use: Yes    Alcohol/week: 7.0 standard drinks of alcohol    Types: 7 Shots of liquor per week    Comment: drinks rum   Drug use: No   Sexual activity: Not on file   Tobacco Counseling Counseling given: Yes  SDOH Screenings   Food Insecurity: No Food Insecurity (08/01/2024)  Housing: Unknown (08/01/2024)  Transportation Needs: No Transportation Needs (08/01/2024)  Utilities: Not At Risk (08/01/2024)  Alcohol Screen: Low Risk (07/30/2023)  Depression (PHQ2-9): Low Risk (08/01/2024)  Financial Resource Strain: Low Risk (07/30/2023)  Physical Activity: Sufficiently Active (08/01/2024)  Social Connections: Moderately Integrated (08/01/2024)  Stress: No Stress Concern Present (08/01/2024)  Tobacco Use: Medium Risk (08/01/2024)  Health Literacy: Adequate Health Literacy (08/01/2024)   See flowsheets for full screening details  Depression Screen PHQ 2 & 9 Depression Scale- Over the past 2 weeks, how often have you been bothered by any of the following problems? Little interest or pleasure in doing things: 0 Feeling down, depressed, or hopeless (PHQ Adolescent also includes...irritable): 0 PHQ-2 Total Score:  0 Trouble falling or staying asleep, or sleeping too much: 0 Feeling tired or having little energy: 0 Poor appetite or overeating (PHQ Adolescent also includes...weight loss): 0 Feeling bad about yourself - or that you are a failure or have let yourself or your family down: 0 Trouble concentrating on things, such as reading the newspaper or watching television (PHQ Adolescent also includes...like school work): 0 Moving or speaking so slowly that other people could have noticed. Or the opposite - being so fidgety or restless that you have been moving around a lot more than usual: 0 Thoughts that you would be better off dead, or of hurting yourself in some way: 0 PHQ-9 Total Score: 0 If you checked off any problems, how difficult have these problems made it for you to do your work, take care of things at home, or get along with other people?: Not difficult at all     Goals Addressed             This Visit's Progress    Remain active and independent   On track            Objective:    Today's Vitals   08/01/24 1359  BP: (!) 142/76  Pulse: 68  Weight: 153 lb (69.4 kg)  Height: 5' 9 (1.753 m)   Body mass index is 22.59 kg/m.  Hearing/Vision screen No results found. Immunizations and Health Maintenance Health Maintenance  Topic Date Due   Hepatitis C Screening  Never done   DTaP/Tdap/Td (1 - Tdap) Never  done   Colonoscopy  Never done   Zoster Vaccines- Shingrix (1 of 2) Never done   Influenza Vaccine  03/11/2024   COVID-19 Vaccine (3 - 2025-26 season) 04/11/2024   Pneumococcal Vaccine: 50+ Years (1 of 1 - PCV) 12/28/2024 (Originally 02/16/2001)   Medicare Annual Wellness (AWV)  08/01/2025   Meningococcal B Vaccine  Aged Out        Assessment/Plan:  This is a routine wellness examination for Nuchem.  Patient Care Team: Milian, Marry Lenis, FNP as PCP - General (Family Medicine)  I have personally reviewed and noted the following in the patients chart:    Medical and social history Use of alcohol, tobacco or illicit drugs  Current medications and supplements including opioid prescriptions. Functional ability and status Nutritional status Physical activity Advanced directives List of other physicians Hospitalizations, surgeries, and ER visits in previous 12 months Vitals Screenings to include cognitive, depression, and falls Referrals and appointments  No orders of the defined types were placed in this encounter.  In addition, I have reviewed and discussed with patient certain preventive protocols, quality metrics, and best practice recommendations. A written personalized care plan for preventive services as well as general preventive health recommendations were provided to patient.   Ozie Ned, CMA   08/01/2024   Return in 1 year (on 08/01/2025).  After Visit Summary: (MyChart) Due to this being a telephonic visit, the after visit summary with patients personalized plan was offered to patient via MyChart   Nurse Notes: HM Addressed: Vaccines Due: flu, covid, shingles, and Dtap. Hep C Screening, and colonoscopy--pt declined  "

## 2024-08-01 NOTE — Patient Instructions (Signed)
 Micheal Mosley,  Thank you for taking the time for your Medicare Wellness Visit. I appreciate your continued commitment to your health goals. Please review the care plan we discussed, and feel free to reach out if I can assist you further.  Please note that Annual Wellness Visits do not include a physical exam. Some assessments may be limited, especially if the visit was conducted virtually. If needed, we may recommend an in-person follow-up with your provider.  Ongoing Care Seeing your primary care provider every 3 to 6 months helps us  monitor your health and provide consistent, personalized care.   Referrals If a referral was made during today's visit and you haven't received any updates within two weeks, please contact the referred provider directly to check on the status.  Recommended Screenings:  Health Maintenance  Topic Date Due   Hepatitis C Screening  Never done   DTaP/Tdap/Td vaccine (1 - Tdap) Never done   Colon Cancer Screening  Never done   Zoster (Shingles) Vaccine (1 of 2) Never done   Flu Shot  03/11/2024   COVID-19 Vaccine (3 - 2025-26 season) 04/11/2024   Medicare Annual Wellness Visit  07/29/2024   Pneumococcal Vaccine for age over 32 (1 of 1 - PCV) 12/28/2024*   Meningitis B Vaccine  Aged Out  *Topic was postponed. The date shown is not the original due date.       08/01/2024    2:01 PM  Advanced Directives  Does Patient Have a Medical Advance Directive? No  Would patient like information on creating a medical advance directive? No - Patient declined    Vision: Annual vision screenings are recommended for early detection of glaucoma, cataracts, and diabetic retinopathy. These exams can also reveal signs of chronic conditions such as diabetes and high blood pressure.  Dental: Annual dental screenings help detect early signs of oral cancer, gum disease, and other conditions linked to overall health, including heart disease and diabetes.  Please see the attached  documents for additional preventive care recommendations.

## 2024-08-12 ENCOUNTER — Telehealth: Payer: Self-pay

## 2024-08-12 ENCOUNTER — Other Ambulatory Visit: Payer: Self-pay | Admitting: Family Medicine

## 2024-08-12 DIAGNOSIS — E7800 Pure hypercholesterolemia, unspecified: Secondary | ICD-10-CM

## 2024-08-12 MED ORDER — PRAVASTATIN SODIUM 80 MG PO TABS: 80.0000 mg | ORAL_TABLET | Freq: Every day | ORAL | 0 refills | Status: DC

## 2024-08-12 NOTE — Telephone Encounter (Signed)
 Looks like appt during this call was initially scheduled for 08/18/2024 but then it looks like patient or spouse called back and rescheduled the appt for 09/01/2024. Are you willing to send in a refill on his PRAVASTATIN  80 MG to last him until this appt?   Copied from CRM [id number]. Topic: Clinical - Prescription Issue >> Aug 12, 2024 12:00 PM Susanna ORN wrote: Reason for CRM: Patient's wife, Devere, called in stating that her husband needs pravastatin  (PRAVACHOL ) 80 MG tablet refilled & the pharmacy has sent over requests but hasn't received a response. Informed her that due to him being a patient of [facility] & she's no longer with the clinic, he would need to be seen with a different provider in the clinic before the medication can be filled. Offered to get patient scheduled with a new provider via TOC and Devere declined stating that it wouldn't fit into her husband's work schedule and she would call back at a better time. She states he needs this medication.

## 2024-08-12 NOTE — Telephone Encounter (Signed)
 Called and spoke with wife and made her aware that 1x refill was sent and to make sure patient keeps his appt on 1/22 because we wont be able to send in another refill until he is seen. She voiced understanding.

## 2024-08-18 ENCOUNTER — Encounter: Admitting: Family Medicine

## 2024-09-01 ENCOUNTER — Encounter: Payer: Self-pay | Admitting: Family Medicine

## 2024-09-01 ENCOUNTER — Ambulatory Visit

## 2024-09-01 ENCOUNTER — Ambulatory Visit (INDEPENDENT_AMBULATORY_CARE_PROVIDER_SITE_OTHER): Admitting: Family Medicine

## 2024-09-01 VITALS — BP 134/72 | HR 69 | Temp 97.6°F | Ht 69.0 in | Wt 162.0 lb

## 2024-09-01 DIAGNOSIS — Z1329 Encounter for screening for other suspected endocrine disorder: Secondary | ICD-10-CM

## 2024-09-01 DIAGNOSIS — E785 Hyperlipidemia, unspecified: Secondary | ICD-10-CM | POA: Diagnosis not present

## 2024-09-01 DIAGNOSIS — Z23 Encounter for immunization: Secondary | ICD-10-CM

## 2024-09-01 DIAGNOSIS — M25561 Pain in right knee: Secondary | ICD-10-CM

## 2024-09-01 DIAGNOSIS — Z130 Encounter for screening for diseases of the blood and blood-forming organs and certain disorders involving the immune mechanism: Secondary | ICD-10-CM | POA: Diagnosis not present

## 2024-09-01 DIAGNOSIS — Z136 Encounter for screening for cardiovascular disorders: Secondary | ICD-10-CM

## 2024-09-01 DIAGNOSIS — L0291 Cutaneous abscess, unspecified: Secondary | ICD-10-CM | POA: Diagnosis not present

## 2024-09-01 DIAGNOSIS — Z125 Encounter for screening for malignant neoplasm of prostate: Secondary | ICD-10-CM | POA: Diagnosis not present

## 2024-09-01 DIAGNOSIS — Z13228 Encounter for screening for other metabolic disorders: Secondary | ICD-10-CM

## 2024-09-01 DIAGNOSIS — Z1321 Encounter for screening for nutritional disorder: Secondary | ICD-10-CM

## 2024-09-01 LAB — LIPID PANEL

## 2024-09-01 LAB — BAYER DCA HB A1C WAIVED: HB A1C (BAYER DCA - WAIVED): 5.5 % (ref 4.8–5.6)

## 2024-09-01 MED ORDER — SULFAMETHOXAZOLE-TRIMETHOPRIM 800-160 MG PO TABS: 1.0000 | ORAL_TABLET | Freq: Two times a day (BID) | ORAL | 0 refills | Status: AC

## 2024-09-01 NOTE — Patient Instructions (Signed)
 Follow up if symptoms acutely worsen, no improvement over the next 2-3 days, fever develops, or for any other concerns

## 2024-09-01 NOTE — Progress Notes (Signed)
 "  Acute Office Visit  Patient ID: Micheal Mosley, male    DOB: [birth year], 74 y.o.   MRN: [id number]  PCP: Alcus Oneil ORN, FNP  Chief Complaint  Patient presents with   Establish Care   Knee Pain    Patient reports having right knee pain for about a month. Requesting xray.   Mass    Patient reports lump underneath the skin on his back that itches. Patient says he went rabbit hunting about the first of this week and says it back started itching and wife looked at his back and pulled 2 ticks off and since then, there is a raise lump there. Patient reports that lump is still itching.     Subjective:     Knee Pain     Discussed the use of AI scribe software for clinical note transcription with the patient, who gave verbal consent to proceed.  History of Present Illness   Micheal Mosley is a 74 year old male who presents with right knee pain and a lump on his back.  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 Patient stays very active,  - Patient requesting xray R knee  Pruritic back lesion - Lump on back present for approximately one week - Associated pruritus - Two ticks removed from the lesion by his wife  Hyperlipidemia - Currently taking medication for hyperlipidemia       ROS     Objective:    BP 134/72   Pulse 69   Temp 97.6 F (36.4 C)   Ht 5' 9 (1.753 m)   Wt 162 lb (73.5 kg)   SpO2 97%   BMI 23.92 kg/m     Physical Exam Vitals reviewed.  Constitutional:      Appearance: Normal appearance.  HENT:     Head: Normocephalic and atraumatic.  Eyes:     Extraocular Movements: Extraocular movements intact.     Conjunctiva/sclera: Conjunctivae normal.     Pupils: Pupils are equal, round, and reactive to light.   Cardiovascular:     Rate and Rhythm: Normal rate and regular rhythm.     Pulses: Normal pulses.     Heart sounds: Normal heart sounds. No murmur heard. Pulmonary:     Effort: Pulmonary effort is normal. No respiratory distress.     Breath sounds: Normal breath sounds.  Musculoskeletal:        General: Tenderness present. No swelling or deformity. Normal range of motion.     Cervical back: Normal range of motion.     Comments: Pain with palpation along the medial aspect of the R knee  No erythema or swelling  Skin:    General: Skin is warm and dry.     Comments: Approx 2-3 cm mildly fluctuant nodule to the center of the mid back.   No bullseye rash.   Neurological:     General: No focal deficit present.     Mental Status: He is alert and oriented to person, place, and time.  Psychiatric:        Mood and Affect: Mood normal.        Behavior: Behavior normal.   I & D  Date/Time: 09/01/2024 3:18 PM  Performed by: Alcus Oneil ORN, FNP Authorized by: Alcus Oneil  W, FNP   Consent:    Consent obtained:  Verbal   Consent given by:  Patient   Risks, benefits, and alternatives were discussed: yes     Risks discussed:  Bleeding, infection, incomplete drainage and pain Location:    Type:  Abscess   Size:  2-3 cm   Location:  Trunk   Trunk location:  Back Pre-procedure details:    Skin preparation:  Alcohol Anesthesia:    Anesthesia method:  Topical application (ethyl chloride) Procedure type:    Complexity:  Simple Procedure details:    Incision type: 18 gauge needle.   Drainage:  Purulent   Drainage amount:  Moderate   Wound treatment:  Wound left open   Packing materials:  None (covered with bandaid) Post-procedure details:    Procedure completion:  Tolerated well, no immediate complications      No results found for any visits on 09/01/24.     Assessment & Plan:   Problem List Items Addressed This Visit   None Visit Diagnoses       Acute pain of right  knee    -  Primary   Relevant Orders   DG Knee 1-2 Views Right     Abscess       Relevant Medications   sulfamethoxazole -trimethoprim  (BACTRIM  DS) 800-160 MG tablet   Other Relevant Orders   I & D     Hyperlipidemia, unspecified hyperlipidemia type         Screening for endocrine, nutritional, metabolic and immunity disorder       Relevant Orders   Comprehensive metabolic panel with GFR   CBC with Differential   Bayer DCA Hb A1c Waived     Encounter for screening for cardiovascular disorders       Relevant Orders   Lipid Panel     Screening for prostate cancer       Relevant Orders   PSA     Encounter for immunization       Relevant Orders   Flu vaccine HIGH DOSE PF(Fluzone Trivalent) (Completed)       Assessment and Plan    Right knee pain - No known injury but did begin after climbing a ladder a lot. Denies instability. - Xray ordered, awaiting final impression.  - Possibly tendonitis from overuse due to climbing ladder?  - Recommended RICE therapy.  - Follow up in one week if symptoms do not improve or sooner if they worsen.   Abscess - Drained in office - Ordered bactrim  BID x 5 days. - Discussed wound care at home - Follow up if symptoms acutely worsen, no improvement over the next 2-3 days, fever develops, or for any other concerns  Hyperlipidemia - Lipid panel obtained today, results pending - Continue pravastatin  80 mg/day    Meds ordered this encounter  Medications   sulfamethoxazole -trimethoprim  (BACTRIM  DS) 800-160 MG tablet    Sig: Take 1 tablet by mouth 2 (two) times daily for 5 days.    Dispense:  10 tablet    Refill:  0    Please deliver to patient home    Supervising Provider:   JOLINDA NORENE HERO [[id number]]    Return if symptoms worsen or fail to improve.  Oneil LELON Severin, FNP [facility]   "

## 2024-09-02 ENCOUNTER — Other Ambulatory Visit: Payer: Self-pay | Admitting: Family Medicine

## 2024-09-02 ENCOUNTER — Ambulatory Visit: Payer: Self-pay | Admitting: Family Medicine

## 2024-09-02 DIAGNOSIS — E7800 Pure hypercholesterolemia, unspecified: Secondary | ICD-10-CM

## 2024-09-02 LAB — COMPREHENSIVE METABOLIC PANEL WITH GFR
ALT: 17 IU/L (ref 0–44)
AST: 25 IU/L (ref 0–40)
Albumin: 4.5 g/dL (ref 3.8–4.8)
Alkaline Phosphatase: 87 IU/L (ref 47–123)
BUN/Creatinine Ratio: 17 (ref 10–24)
BUN: 18 mg/dL (ref 8–27)
Bilirubin Total: 0.4 mg/dL (ref 0.0–1.2)
CO2: 23 mmol/L (ref 20–29)
Calcium: 9.9 mg/dL (ref 8.6–10.2)
Chloride: 103 mmol/L (ref 96–106)
Creatinine, Ser: 1.06 mg/dL (ref 0.76–1.27)
Globulin, Total: 2.6 g/dL (ref 1.5–4.5)
Glucose: 89 mg/dL (ref 70–99)
Potassium: 4.7 mmol/L (ref 3.5–5.2)
Sodium: 140 mmol/L (ref 134–144)
Total Protein: 7.1 g/dL (ref 6.0–8.5)
eGFR: 74 mL/min/1.73

## 2024-09-02 LAB — CBC WITH DIFFERENTIAL/PLATELET
Basophils Absolute: 0 x10E3/uL (ref 0.0–0.2)
Basos: 0 %
EOS (ABSOLUTE): 0 x10E3/uL (ref 0.0–0.4)
Eos: 1 %
Hematocrit: 41.1 % (ref 37.5–51.0)
Hemoglobin: 13.8 g/dL (ref 13.0–17.7)
Immature Grans (Abs): 0 x10E3/uL (ref 0.0–0.1)
Immature Granulocytes: 0 %
Lymphocytes Absolute: 2 x10E3/uL (ref 0.7–3.1)
Lymphs: 31 %
MCH: 31.3 pg (ref 26.6–33.0)
MCHC: 33.6 g/dL (ref 31.5–35.7)
MCV: 93 fL (ref 79–97)
Monocytes Absolute: 0.6 x10E3/uL (ref 0.1–0.9)
Monocytes: 10 %
Neutrophils Absolute: 3.7 x10E3/uL (ref 1.4–7.0)
Neutrophils: 58 %
Platelets: 278 x10E3/uL (ref 150–450)
RBC: 4.41 x10E6/uL (ref 4.14–5.80)
RDW: 12.5 % (ref 11.6–15.4)
WBC: 6.4 x10E3/uL (ref 3.4–10.8)

## 2024-09-02 LAB — LIPID PANEL
Cholesterol, Total: 192 mg/dL (ref 100–199)
HDL: 64 mg/dL
LDL CALC COMMENT:: 3 ratio (ref 0.0–5.0)
LDL Chol Calc (NIH): 103 mg/dL — AB (ref 0–99)
Triglycerides: 146 mg/dL (ref 0–149)
VLDL Cholesterol Cal: 25 mg/dL (ref 5–40)

## 2024-09-02 LAB — PSA: Prostate Specific Ag, Serum: 1.9 ng/mL (ref 0.0–4.0)

## 2024-09-02 MED ORDER — ROSUVASTATIN CALCIUM 20 MG PO TABS: 20.0000 mg | ORAL_TABLET | Freq: Every day | ORAL | 0 refills | Status: AC

## 2024-09-06 ENCOUNTER — Other Ambulatory Visit: Payer: Self-pay | Admitting: Family Medicine

## 2024-09-06 DIAGNOSIS — M25561 Pain in right knee: Secondary | ICD-10-CM
